# Patient Record
Sex: Male | Born: 1955
Health system: Southern US, Community
[De-identification: ages and names within clinical notes are randomized; demographics above are authoritative.]

## PROBLEM LIST (undated history)

## (undated) DIAGNOSIS — F1020 Alcohol dependence, uncomplicated: Secondary | ICD-10-CM

## (undated) DIAGNOSIS — E119 Type 2 diabetes mellitus without complications: Secondary | ICD-10-CM

## (undated) DIAGNOSIS — I10 Essential (primary) hypertension: Secondary | ICD-10-CM

## (undated) DIAGNOSIS — J45909 Unspecified asthma, uncomplicated: Secondary | ICD-10-CM

## (undated) DIAGNOSIS — F17200 Nicotine dependence, unspecified, uncomplicated: Secondary | ICD-10-CM

---

## 1998-03-25 ENCOUNTER — Emergency Department (HOSPITAL_COMMUNITY): Admission: EM | Admit: 1998-03-25 | Discharge: 1998-03-25 | Payer: Self-pay | Admitting: Emergency Medicine

## 2005-11-08 ENCOUNTER — Emergency Department (HOSPITAL_COMMUNITY): Admission: EM | Admit: 2005-11-08 | Discharge: 2005-11-08 | Payer: Self-pay | Admitting: Emergency Medicine

## 2012-06-19 ENCOUNTER — Encounter (HOSPITAL_COMMUNITY): Payer: Self-pay | Admitting: Adult Health

## 2012-06-19 ENCOUNTER — Emergency Department (HOSPITAL_COMMUNITY): Payer: 59

## 2012-06-19 ENCOUNTER — Other Ambulatory Visit: Payer: Self-pay

## 2012-06-19 ENCOUNTER — Emergency Department (HOSPITAL_COMMUNITY)
Admission: EM | Admit: 2012-06-19 | Discharge: 2012-06-19 | Disposition: A | Discharge status: Home / Self Care | Payer: 59 | Attending: Emergency Medicine | Admitting: Emergency Medicine

## 2012-06-19 DIAGNOSIS — J069 Acute upper respiratory infection, unspecified: Secondary | ICD-10-CM | POA: Insufficient documentation

## 2012-06-19 DIAGNOSIS — R059 Cough, unspecified: Secondary | ICD-10-CM | POA: Insufficient documentation

## 2012-06-19 DIAGNOSIS — B9789 Other viral agents as the cause of diseases classified elsewhere: Secondary | ICD-10-CM

## 2012-06-19 DIAGNOSIS — R05 Cough: Secondary | ICD-10-CM | POA: Insufficient documentation

## 2012-06-19 DIAGNOSIS — F17200 Nicotine dependence, unspecified, uncomplicated: Secondary | ICD-10-CM | POA: Insufficient documentation

## 2012-06-19 LAB — CBC
Hemoglobin: 14 g/dL (ref 13.0–17.0)
Platelets: 250 10*3/uL (ref 150–400)
RBC: 4.78 MIL/uL (ref 4.22–5.81)
WBC: 17.2 10*3/uL — ABNORMAL HIGH (ref 4.0–10.5)

## 2012-06-19 LAB — BASIC METABOLIC PANEL
CO2: 24 mEq/L (ref 19–32)
Chloride: 97 mEq/L (ref 96–112)
Sodium: 134 mEq/L — ABNORMAL LOW (ref 135–145)

## 2012-06-19 LAB — CG4 I-STAT (LACTIC ACID): Lactic Acid, Venous: 1.19 mmol/L (ref 0.5–2.2)

## 2012-06-19 LAB — POCT I-STAT TROPONIN I: Troponin i, poc: 0 ng/mL (ref 0.00–0.08)

## 2012-06-19 MED ORDER — SODIUM CHLORIDE 0.9 % IV BOLUS (SEPSIS)
2000.0000 mL | Freq: Once | INTRAVENOUS | Status: AC
  Administered 2012-06-19: 2000 mL via INTRAVENOUS

## 2012-06-19 MED ORDER — ALBUTEROL SULFATE (5 MG/ML) 0.5% IN NEBU
5.0000 mg | INHALATION_SOLUTION | Freq: Once | RESPIRATORY_TRACT | Status: AC
  Administered 2012-06-19: 5 mg via RESPIRATORY_TRACT
  Filled 2012-06-19: qty 1

## 2012-06-19 MED ORDER — POTASSIUM CHLORIDE CRYS ER 20 MEQ PO TBCR
40.0000 meq | EXTENDED_RELEASE_TABLET | Freq: Once | ORAL | Status: AC
  Administered 2012-06-19: 40 meq via ORAL
  Filled 2012-06-19: qty 2

## 2012-06-19 MED ORDER — POTASSIUM CHLORIDE CRYS ER 20 MEQ PO TBCR: 20.0000 meq | EXTENDED_RELEASE_TABLET | Freq: Two times a day (BID) | ORAL | Status: DC

## 2012-06-19 MED ORDER — ACETAMINOPHEN 325 MG PO TABS
975.0000 mg | ORAL_TABLET | Freq: Once | ORAL | Status: AC
  Administered 2012-06-19: 975 mg via ORAL
  Filled 2012-06-19: qty 3

## 2012-06-19 MED ORDER — ALBUTEROL SULFATE HFA 108 (90 BASE) MCG/ACT IN AERS
2.0000 | INHALATION_SPRAY | RESPIRATORY_TRACT | Status: DC | PRN
  Administered 2012-06-19: 2 via RESPIRATORY_TRACT
  Filled 2012-06-19: qty 6.7

## 2012-06-19 NOTE — ED Provider Notes (Addendum)
History     CSN: [id number]  Arrival date & time 06/19/12  1523   First MD Initiated Contact with Patient 06/19/12 1653      Chief Complaint  Patient presents with  . Shortness of Breath    (Consider location/radiation/quality/duration/timing/severity/associated sxs/prior treatment) HPI Complains of cough productive of yellow sputum, mild shortness of breath and chest pain worse with coughing onset yesterday. With Tylenol with partial relief. He denies any pain presently. Denies nausea or vomiting no other complaint. No other associated symptoms History reviewed. No pertinent past medical history. Past medical history asthma History reviewed. No pertinent past surgical history.  History reviewed. No pertinent family history.  History  Substance Use Topics  . Smoking status: Current Every Day Smoker  . Smokeless tobacco: Not on file  . Alcohol Use: Yes      Review of Systems  Constitutional: Negative.   HENT: Negative.   Respiratory: Positive for cough and shortness of breath.   Cardiovascular: Negative.   Gastrointestinal: Negative.   Musculoskeletal: Negative.   Skin: Negative.   Neurological: Negative.   Hematological: Negative.   Psychiatric/Behavioral: Negative.   All other systems reviewed and are negative.    Allergies  Review of patient's allergies indicates no known allergies.  Home Medications   Current Outpatient Rx  Name  Route  Sig  Dispense  Refill  . ACETAMINOPHEN 500 MG PO TABS   Oral   Take 500 mg by mouth every 6 (six) hours as needed. For pain           BP 144/82  Pulse 108  Temp 101.6 F (38.7 C) (Oral)  Resp 24  SpO2 97%  Physical Exam  Nursing note and vitals reviewed. Constitutional: He appears well-developed and well-nourished. No distress.  HENT:  Head: Normocephalic and atraumatic.  Eyes: Conjunctivae normal are normal. Pupils are equal, round, and reactive to light.  Neck: Neck supple. No tracheal deviation present.  No thyromegaly present.  Cardiovascular: Normal rate and regular rhythm.   No murmur heard. Pulmonary/Chest: Effort normal. No respiratory distress.       Diffuse rhonchi, scant  Abdominal: Soft. Bowel sounds are normal. He exhibits no distension. There is no tenderness.  Musculoskeletal: Normal range of motion. He exhibits no edema and no tenderness.  Neurological: He is alert. Coordination normal.       Awake alert not sleepy  Skin: Skin is warm and dry. No rash noted.  Psychiatric: He has a normal mood and affect.    ED Course  Procedures (including critical care time)  Labs Reviewed  CBC - Abnormal; Notable for the following:    WBC 17.2 (*)     All other components within normal limits  BASIC METABOLIC PANEL - Abnormal; Notable for the following:    Sodium 134 (*)     Potassium 2.9 (*)     Glucose, Bld 183 (*)     All other components within normal limits  POCT I-STAT TROPONIN I   Dg Chest 2 View  06/19/2012  *RADIOLOGY REPORT*  Clinical Data: Shortness of breath, cough, left-sided chest pain  CHEST - 2 VIEW  Comparison: None.  Findings: No active infiltrate or effusion is seen.  There is peribronchial thickening which may indicate bronchitis.  Probable nipple shadows are noted symmetrically at the lung bases.  The heart is mildly enlarged.  There are degenerative changes throughout the thoracic spine.  IMPRESSION: No active lung disease.  Question bronchitis.  Mild cardiomegaly.   Original Report  Authenticated By: Dwyane Dee, M.D.      No diagnosis found. Results for orders placed during the hospital encounter of 06/19/12  CBC      Component Value Range   WBC 17.2 (*) 4.0 - 10.5 K/uL   RBC 4.78  4.22 - 5.81 MIL/uL   Hemoglobin 14.0  13.0 - 17.0 g/dL   HCT 96.2  95.2 - 84.1 %   MCV 87.4  78.0 - 100.0 fL   MCH 29.3  26.0 - 34.0 pg   MCHC 33.5  30.0 - 36.0 g/dL   RDW 32.4  40.1 - 02.7 %   Platelets 250  150 - 400 K/uL  BASIC METABOLIC PANEL      Component Value Range     Sodium 134 (*) 135 - 145 mEq/L   Potassium 2.9 (*) 3.5 - 5.1 mEq/L   Chloride 97  96 - 112 mEq/L   CO2 24  19 - 32 mEq/L   Glucose, Bld 183 (*) 70 - 99 mg/dL   BUN 13  6 - 23 mg/dL   Creatinine, Ser 2.53  0.50 - 1.35 mg/dL   Calcium 9.1  8.4 - 66.4 mg/dL   GFR calc non Af Amer >90  >90 mL/min   GFR calc Af Amer >90  >90 mL/min  POCT I-STAT TROPONIN I      Component Value Range   Troponin i, poc 0.00  0.00 - 0.08 ng/mL   Comment 3           CG4 I-STAT (LACTIC ACID)      Component Value Range   Lactic Acid, Venous 1.19  0.5 - 2.2 mmol/L   Date: 06/19/2012  Rate: 110  Rhythm: sinus tachycardia  QRS Axis: normal  Intervals: normal  ST/T Wave abnormalities: nonspecific ST/T changes  Conduction Disutrbances:none  Narrative Interpretation: LVH with strain pattern  Old EKG Reviewed: none available  Dg Chest 2 View  06/19/2012  *RADIOLOGY REPORT*  Clinical Data: Shortness of breath, cough, left-sided chest pain  CHEST - 2 VIEW  Comparison: None.  Findings: No active infiltrate or effusion is seen.  There is peribronchial thickening which may indicate bronchitis.  Probable nipple shadows are noted symmetrically at the lung bases.  The heart is mildly enlarged.  There are degenerative changes throughout the thoracic spine.  IMPRESSION: No active lung disease.  Question bronchitis.  Mild cardiomegaly.   Original Report Authenticated By: Dwyane Dee, M.D.      Chest x-ray reviewed by me  7:35 PM patient feels much improved after nebulized treatment and ready to home MDM  Plan smoking cessation encouraged. Spent 5 minutes counseling patient on smoking cessation Tylenol for fever or aches Albuterol HFA to go  2 puffsevery 4 hours when necessary shortness of breath or cough. Antibiotics  not prescribed,, no infiltrate on x-ray. Prescription K-Dur Referral resource guide Diagnosis #1. Febrile respiratory illness #2 tobacco abuse #3hypokalemia #4 hyperglycemia     Doug Sou,  MD 06/19/12 1940  Doug Sou, MD 06/19/12 4034

## 2012-06-19 NOTE — ED Notes (Signed)
Pt reports sternal chest pain that feels "like someone hit me in the chest" Pain began last night while washing dishes. Pain is constant. Pain is associated with SOB. Pt continues to fall asleep while speaking to nurse, he is easily arousable to voice. He staes he did not get any sleep last night. He is tachypneic 24-28 Sats 96% RA. Rhales auscultated on left lower- mid. Cough dry.

## 2012-06-25 ENCOUNTER — Emergency Department (HOSPITAL_COMMUNITY)
Admission: EM | Admit: 2012-06-25 | Discharge: 2012-06-25 | Disposition: A | Discharge status: Home / Self Care | Payer: 59 | Attending: Emergency Medicine | Admitting: Emergency Medicine

## 2012-06-25 ENCOUNTER — Encounter (HOSPITAL_COMMUNITY): Payer: Self-pay | Admitting: *Deleted

## 2012-06-25 DIAGNOSIS — J209 Acute bronchitis, unspecified: Secondary | ICD-10-CM | POA: Insufficient documentation

## 2012-06-25 DIAGNOSIS — J208 Acute bronchitis due to other specified organisms: Secondary | ICD-10-CM

## 2012-06-25 DIAGNOSIS — J45909 Unspecified asthma, uncomplicated: Secondary | ICD-10-CM | POA: Insufficient documentation

## 2012-06-25 DIAGNOSIS — F17200 Nicotine dependence, unspecified, uncomplicated: Secondary | ICD-10-CM | POA: Insufficient documentation

## 2012-06-25 DIAGNOSIS — Z79899 Other long term (current) drug therapy: Secondary | ICD-10-CM | POA: Insufficient documentation

## 2012-06-25 DIAGNOSIS — I10 Essential (primary) hypertension: Secondary | ICD-10-CM | POA: Insufficient documentation

## 2012-06-25 HISTORY — DX: Unspecified asthma, uncomplicated: J45.909

## 2012-06-25 HISTORY — DX: Essential (primary) hypertension: I10

## 2012-06-25 NOTE — ED Notes (Signed)
In to assess; unable to locate pt

## 2012-06-25 NOTE — ED Provider Notes (Signed)
History   This chart was scribed for non-physician practitioner working with Juan Booze, MD by Juan Boyle, ED Scribe. This patient was seen in room TR07C/TR07C and the patient's care was started at 4:24PM.    CSN: [id number]  Arrival date & time 06/25/12  1606   First MD Initiated Contact with Patient 06/25/12 1624      No chief complaint on file.   (Consider location/radiation/quality/duration/timing/severity/associated sxs/prior treatment) Patient is a 57 y.o. male presenting with general illness. The history is provided by the patient. No language interpreter was used.  Illness  The current episode started yesterday. The onset was sudden. The problem occurs rarely. The problem has been gradually improving. The problem is moderate. The symptoms are relieved by one or more prescription drugs. Nothing aggravates the symptoms. Pertinent negatives include no congestion, no rhinorrhea and no sore throat.    Juan Boyle is a 57 y.o. male , with a hx of asthma, wheezing, and bronchitis (06/19/12), who presents to the Emergency Department complaining of gradual, progressively improving, wheezing, last episode yesterday (06/24/12). The pt reports he was administered an albuterol inhaler after previous evaluation at [facility] for bronchitis on 06/19/12, however he informs he has run out of his albuterol inhaler medication at this time. The pt is taking his previously prescribed medications at present.   The pt denies fever.   The pt is a current everyday smoker in addition to drinking alcohol.    Pt does not have a PCP at present.    Past Medical History  Diagnosis Date  . Asthma   . Hypertension     History reviewed. No pertinent past surgical history.  No family history on file.  History  Substance Use Topics  . Smoking status: Current Every Day Smoker  . Smokeless tobacco: Not on file  . Alcohol Use: Yes      Review of Systems  HENT: Negative for congestion, sore throat and  rhinorrhea.   All other systems reviewed and are negative.    Allergies  Penicillins  Home Medications   Current Outpatient Rx  Name  Route  Sig  Dispense  Refill  . ACETAMINOPHEN 500 MG PO TABS   Oral   Take 500 mg by mouth every 6 (six) hours as needed. For pain         . POTASSIUM CHLORIDE CRYS ER 20 MEQ PO TBCR   Oral   Take 1 tablet (20 mEq total) by mouth 2 (two) times daily.   10 tablet   0     BP 150/93  Pulse 93  Temp 98 F (36.7 C) (Oral)  Resp 18  SpO2 98%  Physical Exam  Nursing note and vitals reviewed. Constitutional: He is oriented to person, place, and time. He appears well-developed and well-nourished.  HENT:  Head: Atraumatic.  Right Ear: External ear normal.  Left Ear: External ear normal.  Nose: Nose normal.  Mouth/Throat: No oropharyngeal exudate.  Neck: Neck supple.  Cardiovascular: Normal rate, regular rhythm and normal heart sounds.   No murmur heard. Pulmonary/Chest: Effort normal and breath sounds normal.  Abdominal: Soft. Bowel sounds are normal.  Musculoskeletal: Normal range of motion. He exhibits no tenderness.  Lymphadenopathy:    He has no cervical adenopathy.  Neurological: He is alert and oriented to person, place, and time.  Skin: Skin is warm and dry.  Psychiatric: He has a normal mood and affect. His behavior is normal.    ED Course  Procedures (including critical care  time)  DIAGNOSTIC STUDIES: Oxygen Saturation is 98% on room air, normal by my interpretation.    COORDINATION OF CARE:  4:27 PM- Treatment plan discussed with patient. Pt agrees with treatment.      Labs Reviewed - No data to display No results found.   No diagnosis found.  Patient seen on 06/20/11 for URI symptoms.  Found to have mild hypokalemia at that time.  Patient prescribed an albuterol inhaler and replacement potassium.  Patient states that he is feeling better, symptoms have improved, occasional episodes of wheezing.  Denies fever,  nasal congestion, sore throat, cough.  Planned to recheck I-stat, however patient apparently left prior to testing.  MDM   I personally performed the services described in this documentation, which was scribed in my presence. The recorded information has been reviewed and is accurate.         Juan Norman, NP 06/26/12 [phone]

## 2012-06-25 NOTE — ED Notes (Signed)
Pt is here for follow up on breathing and bad cold.  PT was given a pump and white pills to take.  Pt states he has ran out of inhaler.  Pt states feeling better and wants to know if he is getting better.  No fever.  Breathing is better

## 2012-06-26 NOTE — ED Provider Notes (Signed)
Medical screening examination/treatment/procedure(s) were performed by non-physician practitioner and as supervising physician I was immediately available for consultation/collaboration.   [name], MD 06/26/12 [phone]

## 2014-07-22 ENCOUNTER — Emergency Department (HOSPITAL_COMMUNITY)
Admission: EM | Admit: 2014-07-22 | Discharge: 2014-07-22 | Disposition: A | Discharge status: Home / Self Care | Payer: 59 | Attending: Emergency Medicine | Admitting: Emergency Medicine

## 2014-07-22 ENCOUNTER — Encounter (HOSPITAL_COMMUNITY): Payer: Self-pay | Admitting: Emergency Medicine

## 2014-07-22 DIAGNOSIS — Z880 Allergy status to penicillin: Secondary | ICD-10-CM | POA: Diagnosis not present

## 2014-07-22 DIAGNOSIS — M79605 Pain in left leg: Secondary | ICD-10-CM | POA: Diagnosis present

## 2014-07-22 DIAGNOSIS — J45909 Unspecified asthma, uncomplicated: Secondary | ICD-10-CM | POA: Diagnosis not present

## 2014-07-22 DIAGNOSIS — Z720 Tobacco use: Secondary | ICD-10-CM | POA: Diagnosis not present

## 2014-07-22 DIAGNOSIS — I10 Essential (primary) hypertension: Secondary | ICD-10-CM | POA: Diagnosis not present

## 2014-07-22 DIAGNOSIS — M7062 Trochanteric bursitis, left hip: Secondary | ICD-10-CM | POA: Insufficient documentation

## 2014-07-22 DIAGNOSIS — Z79899 Other long term (current) drug therapy: Secondary | ICD-10-CM | POA: Insufficient documentation

## 2014-07-22 DIAGNOSIS — Y9389 Activity, other specified: Secondary | ICD-10-CM | POA: Insufficient documentation

## 2014-07-22 MED ORDER — IBUPROFEN 600 MG PO TABS: 600.0000 mg | ORAL_TABLET | Freq: Four times a day (QID) | ORAL | Status: DC | PRN

## 2014-07-22 MED ORDER — TRAMADOL HCL 50 MG PO TABS: 50.0000 mg | ORAL_TABLET | Freq: Four times a day (QID) | ORAL | Status: DC | PRN

## 2014-07-22 MED ORDER — IBUPROFEN 800 MG PO TABS
800.0000 mg | ORAL_TABLET | Freq: Once | ORAL | Status: AC
  Administered 2014-07-22: 800 mg via ORAL
  Filled 2014-07-22: qty 1
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
## 2014-07-22 NOTE — Discharge Instructions (Signed)
Please read and follow all provided instructions.  Your diagnoses today include:  1. Trochanteric bursitis of left hip     Tests performed today include:  Vital signs. See below for your results today.   Medications prescribed:   Ibuprofen (Motrin, Advil) - anti-inflammatory pain medication  Do not exceed 600mg  ibuprofen every 6 hours, take with food  You have been prescribed an anti-inflammatory medication or NSAID. Take with food. Take smallest effective dose for the shortest duration needed for your pain. Stop taking if you experience stomach pain or vomiting.    Tramadol - narcotic-like pain medication  DO NOT drive or perform any activities that require you to be awake and alert because this medicine can make you drowsy.   Take any prescribed medications only as directed.  Home care instructions:   Follow any educational materials contained in this packet  Follow R.I.C.E. Protocol:  R - rest your injury   I  - use ice on injury without applying directly to skin  C - compress injury with bandage or splint  E - elevate the injury as much as possible  Follow-up instructions: Please follow-up with your primary care provider if you continue to have significant pain in 1 week. In this case you may have a more severe injury that requires further care.   Return instructions:   Please return if your toes or feet are numb or tingling, appear gray or blue, or you have severe pain (also elevate the leg and loosen splint or wrap if you were given one)  Please return to the Emergency Department if you experience worsening symptoms.   Please return if you have any other emergent concerns.  Additional Information:  Your vital signs today were: Pulse 85   Temp(Src) 97.5 F (36.4 C) (Oral)   Resp 18   Ht 4\' 11"  (1.499 m)   Wt 120 lb (54.432 kg)   BMI 24.22 kg/m2   SpO2 97% If your blood pressure (BP) was elevated above 135/85 this visit, please have this repeated by your  doctor within one month. --------------

## 2014-07-22 NOTE — ED Notes (Signed)
Vital signs stable. 
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
## 2015-10-22 ENCOUNTER — Emergency Department (HOSPITAL_COMMUNITY)
Admission: EM | Admit: 2015-10-22 | Discharge: 2015-10-22 | Disposition: A | Discharge status: Home / Self Care | Payer: 59 | Attending: Emergency Medicine | Admitting: Emergency Medicine

## 2015-10-22 ENCOUNTER — Encounter (HOSPITAL_COMMUNITY): Payer: Self-pay | Admitting: *Deleted

## 2015-10-22 DIAGNOSIS — H9192 Unspecified hearing loss, left ear: Secondary | ICD-10-CM | POA: Insufficient documentation

## 2015-10-22 DIAGNOSIS — H918X2 Other specified hearing loss, left ear: Secondary | ICD-10-CM | POA: Diagnosis not present

## 2015-10-22 DIAGNOSIS — F17200 Nicotine dependence, unspecified, uncomplicated: Secondary | ICD-10-CM | POA: Diagnosis not present

## 2015-10-22 DIAGNOSIS — I10 Essential (primary) hypertension: Secondary | ICD-10-CM | POA: Diagnosis not present

## 2015-10-22 DIAGNOSIS — M25512 Pain in left shoulder: Secondary | ICD-10-CM | POA: Diagnosis not present

## 2015-10-22 DIAGNOSIS — H578 Other specified disorders of eye and adnexa: Secondary | ICD-10-CM | POA: Diagnosis present

## 2015-10-22 DIAGNOSIS — J45909 Unspecified asthma, uncomplicated: Secondary | ICD-10-CM | POA: Diagnosis not present

## 2015-10-22 DIAGNOSIS — Z79899 Other long term (current) drug therapy: Secondary | ICD-10-CM | POA: Insufficient documentation

## 2015-10-22 DIAGNOSIS — H11442 Conjunctival cysts, left eye: Secondary | ICD-10-CM | POA: Insufficient documentation

## 2015-10-22 DIAGNOSIS — Z791 Long term (current) use of non-steroidal anti-inflammatories (NSAID): Secondary | ICD-10-CM | POA: Diagnosis not present

## 2015-10-22 DIAGNOSIS — H109 Unspecified conjunctivitis: Secondary | ICD-10-CM | POA: Diagnosis not present

## 2015-10-22 MED ORDER — NAPROXEN 500 MG PO TABS: 500.0000 mg | ORAL_TABLET | Freq: Two times a day (BID) | ORAL | Status: DC

## 2015-10-22 MED FILL — NAPROXEN 500 MG TABLET: 500 | 30 days supply | Qty: 60 | Fill #0

## 2015-10-22 NOTE — Discharge Instructions (Signed)
1. Medications: naprosyn as needed for your shoulder pain, usual home medications 2. Treatment: rest, drink plenty of fluids,  3. Follow Up: Please followup with ophthalmology for your eye and ENT for your hearing loss; if you do not have a primary care doctor use the resource guide provided to find one; Please return to the ER for vision changes, headaches or other concerns

## 2015-10-22 NOTE — ED Provider Notes (Signed)
CSN: [id number]     Arrival date & time 10/22/15  1157 History   First MD Initiated Contact with Patient 10/22/15 1430     Chief Complaint  Patient presents with  . Eye Problem     (Consider location/radiation/quality/duration/timing/severity/associated sxs/prior Treatment) The history is provided by the patient and medical records. No language interpreter was used.     Juan Boyle is a 60 y.o. male  with a hx of asthma presents to the Emergency Department with multiple complaints.  Pt c/o "growth" to the left eye.  He denies changes In vision, pain, decreased EOMs, photophobia.  He reports it has been growing slowly for greater than one month. He has not seen an ophthalmologist for this. He denies trauma to the eye or foreign body in the eye.  Patient also complaining of gradually occurring hearing loss in the left ear. Patient reports that it has been slowly worsening over the last 6 months. He denies tinnitus, vertigo, balance issues.  Denies trauma to his ear. He reports cleaning is irregularly but does not use Q-tips. No drainage from the ear.  Patient also complaining of left shoulder pain. He reports that it occurs intermittently for 3 or 4 months. 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 At other times his shoulder does not hurt. He denies trauma.  No treatment for this prior to arrival.  Past Medical History  Diagnosis Date  . Asthma   . Hypertension    History reviewed. No pertinent past surgical history. History reviewed. No pertinent family history. Social History  Substance Use Topics  . Smoking status: Current Every Day Smoker -- 0.50 packs/day for 40 years  . Smokeless tobacco: None  . Alcohol Use: 12.6 oz/week    21 Cans of beer per week    Review of Systems  Constitutional: Negative for fever, diaphoresis, appetite change, fatigue and unexpected weight change.  HENT: Positive for hearing loss ( > 6 mos). Negative for mouth sores.    Eyes: Negative for photophobia, pain, discharge, redness, itching and visual disturbance.       Growth  Respiratory: Negative for cough, chest tightness, shortness of breath and wheezing.   Cardiovascular: Negative for chest pain.  Gastrointestinal: Negative for nausea, vomiting, abdominal pain, diarrhea and constipation.  Endocrine: Negative for polydipsia, polyphagia and polyuria.  Genitourinary: Negative for dysuria, urgency, frequency and hematuria.  Musculoskeletal: Positive for arthralgias ( Left shoulder). Negative for back pain and neck stiffness.  Skin: Negative for rash.  Allergic/Immunologic: Negative for immunocompromised state.  Neurological: Negative for syncope, light-headedness and headaches.  Hematological: Does not bruise/bleed easily.  Psychiatric/Behavioral: Negative for sleep disturbance. The patient is not nervous/anxious.       Allergies  Penicillins  Home Medications   Prior to Admission medications   Medication Sig Start Date End Date Taking? Authorizing Provider  albuterol (PROVENTIL HFA;VENTOLIN HFA) 108 (90 BASE) MCG/ACT inhaler Inhale 2 puffs into the lungs every 6 (six) hours as needed. For wheezing/shortness of breath    Historical Provider, MD  ibuprofen (ADVIL,MOTRIN) 600 MG tablet Take 1 tablet (600 mg total) by mouth every 6 (six) hours as needed. 07/22/14   Carlisle Cater, PA-C  naproxen (NAPROSYN) 500 MG tablet Take 1 tablet (500 mg total) by mouth 2 (two) times daily with a meal. 10/22/15   Layton Tappan, PA-C  potassium chloride SA (K-DUR,KLOR-CON) 20 MEQ tablet Take 1 tablet (20 mEq total) by mouth 2 (two) times daily. 06/19/12  Orlie Dakin, MD  traMADol (ULTRAM) 50 MG tablet Take 1 tablet (50 mg total) by mouth every 6 (six) hours as needed. 07/22/14   Carlisle Cater, PA-C   BP 190/96 mmHg  Pulse 95  Temp(Src) 98.6 F (37 C) (Oral)  Resp 16  SpO2 100% Physical Exam  Constitutional: He is oriented to person, place, and time. He appears  well-developed and well-nourished. No distress.  HENT:  Head: Normocephalic and atraumatic.  Right Ear: Hearing, tympanic membrane, external ear and ear canal normal. Tympanic membrane is not injected, not scarred, not perforated and not erythematous.  Left Ear: Tympanic membrane, external ear and ear canal normal. Tympanic membrane is not injected, not scarred, not perforated and not erythematous.  Nose: Nose normal. No mucosal edema or rhinorrhea.  Mouth/Throat: Uvula is midline, oropharynx is clear and moist and mucous membranes are normal. No uvula swelling. No oropharyngeal exudate, posterior oropharyngeal edema, posterior oropharyngeal erythema or tonsillar abscesses.  Decreased hearing of the left ear on gross hearing assessment  Eyes: Conjunctivae, EOM and lids are normal. Pupils are equal, round, and reactive to light. Lids are everted and swept, no foreign bodies found. Right eye exhibits no chemosis, no discharge and no exudate. No foreign body present in the right eye. Left eye exhibits no chemosis, no discharge and no exudate. No foreign body present in the left eye. Right conjunctiva is not injected. Right conjunctiva has no hemorrhage. Left conjunctiva is not injected. Left conjunctiva has no hemorrhage.  Slit lamp exam:      The right eye shows no corneal abrasion, no corneal flare, no corneal ulcer, no foreign body, no fluorescein uptake and no anterior chamber bulge.       The left eye shows no corneal abrasion, no corneal flare, no corneal ulcer, no foreign body, no fluorescein uptake and no anterior chamber bulge.  Pupils equal round and reactive to light No vertical, horizontal or rotational nystagmus No visible foreign body No corneal flare No herpetic lesions to the face or around the eye Left upper lid with cyst of the conjunctiva  Visual Acuity:  Bilateral Distance: 20/50  R Distance: 20/80  L Distance: 20/80  Neck: Normal range of motion.  Full range of motion  without pain No midline or paraspinal tenderness No nuchal rigidity; no meningeal signs  Cardiovascular: Normal rate, regular rhythm, normal heart sounds and intact distal pulses.   No murmur heard. Pulmonary/Chest: Effort normal. No respiratory distress.  Musculoskeletal: Normal range of motion.  FROM of bilateral shoulders without joint line pain  Neurological: He is alert and oriented to person, place, and time.  Mental Status:  Alert, oriented, thought content appropriate. Speech fluent without evidence of aphasia. Able to follow 2 step commands without difficulty.   Cranial Nerves:  II:  Peripheral visual fields grossly normal, pupils equal, round, reactive to light III,IV, VI: ptosis not present, extra-ocular motions intact bilaterally  V,VII: smile symmetric, VIII: hearing grossly decreased on the left  IX,X: midline uvula rise XI: bilateral shoulder shrug equal XII: midline tongue extension   Skin: Skin is warm and dry. No rash noted. He is not diaphoretic. No erythema.  Psychiatric: He has a normal mood and affect.  Nursing note and vitals reviewed.   ED Course  Procedures (including critical care time)   MDM   Final diagnoses:  Conjunctival cyst of left eye  Left ear hearing loss  Left shoulder pain    BREYSON FIEBELKORN presents with several complaints.  The growth  on patient's conjunctiva appears to be an inclusion cyst versus tumor. It is not necrotic. No changes in his vision. Full EOMs.  She will need follow-up with ophthalmology for this.  Patient with some hearing loss on gross assessment. No cerumen in the canal. No trauma to the TM. No signs of infection.  Patient will need ear nose and throat follow-up for this.  Patient with full range of motion of left shoulder. No joint line tenderness. No trauma. No x-ray indicated at this time. Suspect strain versus arthritis. Will give Naprosyn to be taken with meals for pain control. He is to follow-up with his primary  care provider for this.  Patient noted to be hypertensive in the emergency department.  No signs of hypertensive urgency.  Discussed with patient the need for close follow-up and management by their primary care physician.    Jarrett Soho Hallie Ertl, PA-C 10/22/15 [place], MD 10/25/15 [phone]

## 2015-10-22 NOTE — ED Notes (Signed)
Pt reports left eye growth x 1 month, also having difficulty hearing out of left ear for extended amount of time.

## 2015-10-26 DIAGNOSIS — H18413 Arcus senilis, bilateral: Secondary | ICD-10-CM | POA: Diagnosis not present

## 2015-10-26 DIAGNOSIS — H02839 Dermatochalasis of unspecified eye, unspecified eyelid: Secondary | ICD-10-CM | POA: Diagnosis not present

## 2015-10-26 DIAGNOSIS — H269 Unspecified cataract: Secondary | ICD-10-CM | POA: Diagnosis not present

## 2015-10-26 DIAGNOSIS — D3100 Benign neoplasm of unspecified conjunctiva: Secondary | ICD-10-CM | POA: Diagnosis not present

## 2015-10-29 DIAGNOSIS — H9193 Unspecified hearing loss, bilateral: Secondary | ICD-10-CM | POA: Diagnosis not present

## 2015-10-29 DIAGNOSIS — F17200 Nicotine dependence, unspecified, uncomplicated: Secondary | ICD-10-CM | POA: Insufficient documentation

## 2015-10-29 DIAGNOSIS — H903 Sensorineural hearing loss, bilateral: Secondary | ICD-10-CM | POA: Diagnosis not present

## 2016-12-26 ENCOUNTER — Encounter (HOSPITAL_COMMUNITY): Payer: Self-pay | Admitting: Emergency Medicine

## 2016-12-26 ENCOUNTER — Emergency Department (HOSPITAL_COMMUNITY)
Admission: EM | Admit: 2016-12-26 | Discharge: 2016-12-27 | Disposition: A | Discharge status: Home / Self Care | Payer: PRIVATE HEALTH INSURANCE | Attending: Emergency Medicine | Admitting: Emergency Medicine

## 2016-12-26 DIAGNOSIS — Y92000 Kitchen of unspecified non-institutional (private) residence as  the place of occurrence of the external cause: Secondary | ICD-10-CM | POA: Diagnosis not present

## 2016-12-26 DIAGNOSIS — J45909 Unspecified asthma, uncomplicated: Secondary | ICD-10-CM | POA: Diagnosis not present

## 2016-12-26 DIAGNOSIS — Y9389 Activity, other specified: Secondary | ICD-10-CM | POA: Insufficient documentation

## 2016-12-26 DIAGNOSIS — Z23 Encounter for immunization: Secondary | ICD-10-CM | POA: Diagnosis not present

## 2016-12-26 DIAGNOSIS — Y998 Other external cause status: Secondary | ICD-10-CM | POA: Diagnosis not present

## 2016-12-26 DIAGNOSIS — S61412A Laceration without foreign body of left hand, initial encounter: Secondary | ICD-10-CM | POA: Insufficient documentation

## 2016-12-26 DIAGNOSIS — W290XXA Contact with powered kitchen appliance, initial encounter: Secondary | ICD-10-CM | POA: Diagnosis not present

## 2016-12-26 DIAGNOSIS — S61411A Laceration without foreign body of right hand, initial encounter: Secondary | ICD-10-CM

## 2016-12-26 DIAGNOSIS — Z79899 Other long term (current) drug therapy: Secondary | ICD-10-CM | POA: Diagnosis not present

## 2016-12-26 DIAGNOSIS — F17200 Nicotine dependence, unspecified, uncomplicated: Secondary | ICD-10-CM | POA: Insufficient documentation

## 2016-12-26 NOTE — ED Notes (Addendum)
Pt hypertensive in triage, states he is supposed to take BP medications but does not. Denies chest pain/headache

## 2016-12-26 NOTE — ED Triage Notes (Signed)
Pt states he cut his left hand (between middle and ring finger) on a garbage disposal. Sensations intact to the left hand.

## 2016-12-27 ENCOUNTER — Emergency Department (HOSPITAL_COMMUNITY): Payer: PRIVATE HEALTH INSURANCE

## 2016-12-27 MED ORDER — ALBUTEROL SULFATE HFA 108 (90 BASE) MCG/ACT IN AERS
2.0000 | INHALATION_SPRAY | Freq: Once | RESPIRATORY_TRACT | Status: AC
  Administered 2016-12-27: 2 via RESPIRATORY_TRACT
  Filled 2016-12-27: qty 6.7

## 2016-12-27 MED ORDER — HYDROCODONE-ACETAMINOPHEN 5-325 MG PO TABS
1.0000 | ORAL_TABLET | Freq: Once | ORAL | Status: AC
  Administered 2016-12-27: 1 via ORAL
  Filled 2016-12-27: qty 1

## 2016-12-27 MED ORDER — LIDOCAINE HCL (PF) 1 % IJ SOLN
5.0000 mL | Freq: Once | INTRAMUSCULAR | Status: AC
  Administered 2016-12-27: 5 mL
  Filled 2016-12-27: qty 5

## 2016-12-27 MED ORDER — TETANUS-DIPHTH-ACELL PERTUSSIS 5-2.5-18.5 LF-MCG/0.5 IM SUSP
0.5000 mL | Freq: Once | INTRAMUSCULAR | Status: AC
  Administered 2016-12-27: 0.5 mL via INTRAMUSCULAR
  Filled 2016-12-27: qty 0.5

## 2016-12-27 NOTE — ED Provider Notes (Signed)
[facility] DEPT Provider Note   CSN: [id number] Arrival date & time: 12/26/16  2018     History   Chief Complaint Chief Complaint  Patient presents with  . Extremity Laceration    HPI DESTYN SCHUYLER is a 61 y.o. male.  HPI Patient presents to the emergency department with a laceration between the third and fourth digits on the left hand.  The patient states he reached into a garbage disposal to retrieve an item and that is when his hand got lacerated.  The patient states that he is able move his fingers and has sensation intact.  Patient states nothing seems make the condition better, movement makes the pain worse. Past Medical History:  Diagnosis Date  . Asthma   . Hypertension     There are no active problems to display for this patient.   History reviewed. No pertinent surgical history.     Home Medications    Prior to Admission medications   Medication Sig Start Date End Date Taking? Authorizing Provider  albuterol (PROVENTIL HFA;VENTOLIN HFA) 108 (90 BASE) MCG/ACT inhaler Inhale 2 puffs into the lungs every 6 (six) hours as needed. For wheezing/shortness of breath    [provider]  ibuprofen (ADVIL,MOTRIN) 600 MG tablet Take 1 tablet (600 mg total) by mouth every 6 (six) hours as needed. 07/22/14   Carlisle Cater, PA-C  naproxen (NAPROSYN) 500 MG tablet Take 1 tablet (500 mg total) by mouth 2 (two) times daily with a meal. 10/22/15   Muthersbaugh, Jarrett Soho, PA-C  potassium chloride SA (K-DUR,KLOR-CON) 20 MEQ tablet Take 1 tablet (20 mEq total) by mouth 2 (two) times daily. 06/19/12   Orlie Dakin, MD  traMADol (ULTRAM) 50 MG tablet Take 1 tablet (50 mg total) by mouth every 6 (six) hours as needed. 07/22/14   Carlisle Cater, PA-C    Family History No family history on file.  Social History Social History  Substance Use Topics  . Smoking status: Current Every Day Smoker    Packs/day: 0.50    Years: 40.00  . Smokeless tobacco: Never Used  .  Alcohol use 12.6 oz/week    21 Cans of beer per week     Allergies   Penicillins   Review of Systems Review of Systems All other systems negative except as documented in the HPI. All pertinent positives and negatives as reviewed in the HPI.  Physical Exam Updated Vital Signs BP (!) 208/106   Pulse 77   Temp 98.3 F (36.8 C) (Oral)   Resp 18   Ht 4\' 11"  (1.499 m)   Wt 63.5 kg (140 lb)   SpO2 98%   BMI 28.28 kg/m   Physical Exam  Constitutional: He is oriented to person, place, and time. He appears well-developed and well-nourished. No distress.  HENT:  Head: Normocephalic and atraumatic.  Eyes: Pupils are equal, round, and reactive to light.  Pulmonary/Chest: Effort normal.  Musculoskeletal:       Left hand: He exhibits tenderness and laceration. He exhibits normal range of motion, normal capillary refill and no deformity.       Hands: Neurological: He is alert and oriented to person, place, and time.  Skin: Skin is warm and dry.  Psychiatric: He has a normal mood and affect.  Nursing note and vitals reviewed.    ED Treatments / Results  Labs (all labs ordered are listed, but only abnormal results are displayed) Labs Reviewed - No data to display  EKG  EKG Interpretation None  Radiology No results found.  Procedures Procedures (including critical care time)  Medications Ordered in ED Medications  Tdap (BOOSTRIX) injection 0.5 mL (not administered)  HYDROcodone-acetaminophen (NORCO/VICODIN) 5-325 MG per tablet 1 tablet (not administered)     Initial Impression / Assessment and Plan / ED Course  I have reviewed the triage vital signs and the nursing notes.  Pertinent labs & imaging results that were available during my care of the patient were reviewed by me and considered in my medical decision making (see chart for details). Patient will get x-rays.  Due to the fact that it was a blunt trauma.  Patient will be sutured by the overnight PA-C  Harlene Ramus.  Final Clinical Impressions(s) / ED Diagnoses   Final diagnoses:  None    New Prescriptions New Prescriptions   No medications on file     Dalia Heading, PA-C 12/27/16 0040    Ward, Delice Bison, DO 12/27/16 [phone]

## 2016-12-27 NOTE — ED Provider Notes (Signed)
Hand-off from [employer], PA-C. Dispo pending xray and lac repair.   See initial provider's note for full HPI.  Briefly patient is a 61 year old male who presents to the ED with complaint of right hand laceration that occurred around 9 PM while he was at work. Patient reports placing his hand in a garbage disposal to retrieve an item when his hand was cut. Denies numbness, tingling, weakness. Denies taking medications prior to arrival. Tetanus status unknown. Denies use of anticoagulants. Bleeding controlled prior to arrival.  Physical Exam  BP (!) 208/106   Pulse 77   Temp 98.3 F (36.8 C) (Oral)   Resp 18   Ht 4\' 11"  (1.499 m)   Wt 63.5 kg (140 lb)   SpO2 98%   BMI 28.28 kg/m   Physical Exam  Constitutional: He is oriented to person, place, and time. He appears well-developed and well-nourished.  HENT:  Head: Normocephalic and atraumatic.  Eyes: Conjunctivae and EOM are normal. Right eye exhibits no discharge. Left eye exhibits no discharge. No scleral icterus.  Neck: Normal range of motion. Neck supple.  Cardiovascular: Normal rate and intact distal pulses.   Pulmonary/Chest: Effort normal.  Musculoskeletal: Normal range of motion. He exhibits no edema or tenderness.  FROM of right digits, hand and wrist with 5/5 strength. Equal grip strength bilaterally. Sensation grossly intact. 2+ radial pulse. Cap refill <2.   Neurological: He is alert and oriented to person, place, and time.  Skin: Skin is warm and dry. Capillary refill takes less than 2 seconds.  5cm laceration present between right 3rd and 4th digits, no visible foreign bodies, bone or tendons. No active bleeding.   Nursing note and vitals reviewed.   ED Course  .Marland KitchenLaceration Repair Date/Time: 12/27/2016 3:21 AM Performed by: Nona Dell Authorized by: Nona Dell   Consent:    Consent obtained:  Verbal   Consent given by:  Patient Anesthesia (see MAR for exact dosages):    Anesthesia  method:  Local infiltration   Local anesthetic:  Lidocaine 1% w/o epi Laceration details:    Location:  Hand   Length (cm):  5 Repair type:    Repair type:  Simple Pre-procedure details:    Preparation:  Patient was prepped and draped in usual sterile fashion and imaging obtained to evaluate for foreign bodies Exploration:    Wound exploration: wound explored through full range of motion and entire depth of wound probed and visualized     Wound extent: no foreign bodies/material noted, no muscle damage noted, no nerve damage noted, no tendon damage noted, no underlying fracture noted and no vascular damage noted     Contaminated: no   Treatment:    Area cleansed with:  Betadine and saline   Amount of cleaning:  Standard   Irrigation solution:  Sterile saline   Irrigation method:  Syringe   Visualized foreign bodies/material removed: no   Skin repair:    Repair method:  Sutures   Suture size:  4-0   Suture material:  Prolene   Suture technique:  Simple interrupted   Number of sutures:  11 Approximation:    Approximation:  Close   Vermilion border: well-aligned   Post-procedure details:    Dressing:  Antibiotic ointment and non-adherent dressing   Patient tolerance of procedure:  Tolerated well, no immediate complications    MDM Pressure irrigation performed. Wound explored and base of wound visualized in a bloodless field without evidence of foreign body.  Laceration occurred <  8 hours prior to repair which was well tolerated. Tdap updated.  Pt has no comorbidities to effect normal wound healing. Pt discharged without antibiotics.  Discussed suture home care with patient and answered questions. Pt to follow-up for wound check and suture removal in 7 days; they are to return to the ED sooner for signs of infection. Pt is hemodynamically stable with no complaints prior to dc.    Patient noted to be hypertensive in the ED. Denies any associated symptoms. Patient denies headache,  visual changes, lightheadedness, dizziness, shortness of breath, chest pain, abdominal pain, numbness, tingling, weakness. No signs of hypertensive emergency or urgency at this time. Patient reports history of high blood pressure but states he has never been on medication. Patient denies currently having a primary care provider. Patient given follow-up information for outpatient PCP, advised to follow-up within the next week to have his blood pressure rechecked for further management.        Nona Dell, PA-C 12/27/16 0323    Ward, Delice Bison, DO 12/27/16 [phone]

## 2016-12-27 NOTE — ED Notes (Signed)
EDP at bedside for sutures

## 2016-12-27 NOTE — Discharge Instructions (Signed)
Keep your wound clean using Dial antibacterial soap and water, pat dry. You may also apply a small amount of antibiotic ointment such as Neosporin to wound daily. You may take 600 mg ibuprofen every 6 hours as needed for pain relief. I also recommend resting, elevating and applying ice to right arm for 15-20 minutes 3-4 times daily to help with pain and swelling. Return to the emergency department or be seen by your primary care provider in 7 days for suture removal. Return to the emergency department sooner if symptoms worsen or new onset of fever, redness, swelling, warmth, drainage, decreased range of motion, numbness, tingling, weakness.   I also recommend following up with the primary care provider's office is below within the next week for follow-up evaluation to have your blood pressure recheck.

## 2016-12-28 ENCOUNTER — Ambulatory Visit (HOSPITAL_COMMUNITY)
Admission: EM | Admit: 2016-12-28 | Discharge: 2016-12-28 | Disposition: A | Discharge status: Home / Self Care | Payer: 59 | Attending: Family Medicine | Admitting: Family Medicine

## 2016-12-28 ENCOUNTER — Encounter (HOSPITAL_COMMUNITY): Payer: Self-pay | Admitting: Emergency Medicine

## 2016-12-28 DIAGNOSIS — I10 Essential (primary) hypertension: Secondary | ICD-10-CM | POA: Diagnosis not present

## 2016-12-28 DIAGNOSIS — S61219A Laceration without foreign body of unspecified finger without damage to nail, initial encounter: Secondary | ICD-10-CM

## 2016-12-28 DIAGNOSIS — S61412A Laceration without foreign body of left hand, initial encounter: Secondary | ICD-10-CM | POA: Diagnosis not present

## 2016-12-28 DIAGNOSIS — Z760 Encounter for issue of repeat prescription: Secondary | ICD-10-CM | POA: Diagnosis not present

## 2016-12-28 MED ORDER — HYDROCODONE-ACETAMINOPHEN 5-325 MG PO TABS: 1.0000 | ORAL_TABLET | Freq: Four times a day (QID) | ORAL | 0 refills | Status: DC | PRN

## 2016-12-28 MED ORDER — ALBUTEROL SULFATE HFA 108 (90 BASE) MCG/ACT IN AERS: 2.0000 | INHALATION_SPRAY | RESPIRATORY_TRACT | 0 refills | Status: DC | PRN

## 2016-12-28 MED ORDER — HYDROCHLOROTHIAZIDE 25 MG PO TABS: 25.0000 mg | ORAL_TABLET | Freq: Every day | ORAL | 0 refills | Status: DC

## 2016-12-28 MED FILL — HYDROCODON-APAP 5-325: 5-325 | 2 days supply | Qty: 10 | Fill #0

## 2016-12-28 MED FILL — HYDROCHLOROTHIAZIDE 25 MG T: 25 | 30 days supply | Qty: 30 | Fill #0

## 2016-12-28 NOTE — Discharge Instructions (Signed)
Please keep your scheduled follow up appointments.

## 2016-12-28 NOTE — ED Triage Notes (Signed)
Pt here today for follow up from his ED visit two days ago for hand laceration.  He was told at the time to establish a PCP to monitor his HBP.  Pt has not changed his dressing on his hand and has not washed it per the AVS instructions.  Pt's family states she tried to make an appointment to establish care but they could not see him until next Wednesday and he needs something for pain for the hand.  Pt's hand is red and swollen, but the wound appears to be intact at the point of sutures.  He has an open wound between the 2nd & 3rd fingers.  The dirty dressing was removed from the pt's hand.

## 2016-12-28 NOTE — ED Notes (Signed)
Wound cleaned with saline, dried, and dressed with nonadhereant dressing and wrapped with kerlix.

## 2016-12-28 NOTE — ED Provider Notes (Signed)
  [facility]   [id number] 12/28/16 Arrival Time: 1112  ASSESSMENT & PLAN:  1. Laceration of left hand without complication, including fingers, initial encounter   2. Essential hypertension    Erythema due to inflammation. No signs of infection. Continue to watch.  Meds ordered this encounter  Medications  . hydrochlorothiazide (HYDRODIURIL) 25 MG tablet    Sig: Take 1 tablet (25 mg total) by mouth daily.    Dispense:  30 tablet    Refill:  0  . HYDROcodone-acetaminophen (NORCO/VICODIN) 5-325 MG tablet    Sig: Take 1 tablet by mouth every 6 (six) hours as needed for moderate pain or severe pain.    Dispense:  10 tablet    Refill:  0  . albuterol (PROVENTIL HFA;VENTOLIN HFA) 108 (90 Base) MCG/ACT inhaler    Sig: Inhale 2 puffs into the lungs every 4 (four) hours as needed for wheezing or shortness of breath.    Dispense:  1 Inhaler    Refill:  0   Pt requests alb inhaler. With hand still swollen, needs pain medication. Sedation precautions. Will take HCTZ for HTN. Recommend f/u 1-2 weeks for HTN. Continue wound care. Agrees to change bandage regularly.  Reviewed expectations re: course of current medical issues. Questions answered. Outlined signs and symptoms indicating need for more acute intervention. Patient verbalized understanding. After Visit Summary given.   SUBJECTIVE:  Juan Boyle is a 61 y.o. male who presents for recheck of hand wound. Seen in ED 2 days ago. Note reviewed. Still having significant discomfort. Has not changed bandage. Notice increased BP. Needs refill of HCTZ.   ROS: As per HPI.   OBJECTIVE:  Vitals:   12/28/16 1149  BP: (!) 165/85  Pulse: 79  Temp: 97.7 F (36.5 C)  TempSrc: Oral  SpO2: 99%     General appearance: alert; no distress Lungs: clear to auscultation bilaterally Heart: regular rate and rhythm Extremities: R hand wound without signs of infection; some erythema around stitches    Allergies  Allergen  Reactions  . Penicillins Anaphylaxis    Has patient had a PCN reaction causing immediate rash, facial/tongue/throat swelling, SOB or lightheadedness with hypotension: Yes Has patient had a PCN reaction causing severe rash involving mucus membranes or skin necrosis: Yes Has patient had a PCN reaction that required hospitalization: Yes Has patient had a PCN reaction occurring within the last 10 years: No If all of the above answers are "NO", then may proceed with Cephalosporin use.     PMHx, SurgHx, SocialHx, Medications, and Allergies were reviewed in the Visit Navigator and updated as appropriate.      Vanessa Kick, MD 01/01/17 [phone]

## 2017-01-03 ENCOUNTER — Inpatient Hospital Stay: Payer: Self-pay

## 2017-01-03 DIAGNOSIS — I10 Essential (primary) hypertension: Secondary | ICD-10-CM | POA: Insufficient documentation

## 2017-01-03 NOTE — Progress Notes (Deleted)
Patient ID: Juan Boyle, male   DOB: 1955-07-06, 61 y.o.   MRN: [id number] After being seen in the ED for R hand laceration on 12/26/2016.  Tdap was given.  BP was high.  #11 sutures were placed.  He was seen again in the ED on 12/28/2016 due to hand swelling and pain.  He was treated with pain meds and HCTZ was resumed for htn.

## 2017-02-05 DIAGNOSIS — I10 Essential (primary) hypertension: Secondary | ICD-10-CM | POA: Diagnosis not present

## 2017-02-05 DIAGNOSIS — J4520 Mild intermittent asthma, uncomplicated: Secondary | ICD-10-CM | POA: Diagnosis not present

## 2017-02-05 DIAGNOSIS — Z720 Tobacco use: Secondary | ICD-10-CM | POA: Diagnosis not present

## 2017-02-05 DIAGNOSIS — L84 Corns and callosities: Secondary | ICD-10-CM | POA: Diagnosis not present

## 2017-02-05 DIAGNOSIS — R358 Other polyuria: Secondary | ICD-10-CM | POA: Diagnosis not present

## 2017-02-16 MED FILL — HYDROCHLOROTHIAZIDE 25 MG T: 25 | 30 days supply | Qty: 30 | Fill #0

## 2017-03-09 DIAGNOSIS — I10 Essential (primary) hypertension: Secondary | ICD-10-CM | POA: Diagnosis not present

## 2017-03-09 DIAGNOSIS — J4520 Mild intermittent asthma, uncomplicated: Secondary | ICD-10-CM | POA: Diagnosis not present

## 2017-03-09 DIAGNOSIS — Z125 Encounter for screening for malignant neoplasm of prostate: Secondary | ICD-10-CM | POA: Diagnosis not present

## 2017-03-09 DIAGNOSIS — Z1211 Encounter for screening for malignant neoplasm of colon: Secondary | ICD-10-CM | POA: Diagnosis not present

## 2017-03-09 DIAGNOSIS — Z0000 Encounter for general adult medical examination without abnormal findings: Secondary | ICD-10-CM | POA: Diagnosis not present

## 2017-03-09 MED FILL — AMLODIPINE BESYLATE 10 MG T: 10 | 30 days supply | Qty: 30 | Fill #0

## 2017-03-12 MED FILL — HYDROCHLOROTHIAZIDE 25 MG T: 25 | 90 days supply | Qty: 90 | Fill #0

## 2017-04-10 DIAGNOSIS — I10 Essential (primary) hypertension: Secondary | ICD-10-CM | POA: Diagnosis not present

## 2017-04-10 DIAGNOSIS — J4520 Mild intermittent asthma, uncomplicated: Secondary | ICD-10-CM | POA: Diagnosis not present

## 2017-04-10 DIAGNOSIS — R739 Hyperglycemia, unspecified: Secondary | ICD-10-CM | POA: Diagnosis not present

## 2017-04-10 DIAGNOSIS — Z1211 Encounter for screening for malignant neoplasm of colon: Secondary | ICD-10-CM | POA: Diagnosis not present

## 2017-04-10 MED FILL — LISINOPRIL 10 MG TABS: 10 | 90 days supply | Qty: 90 | Fill #0

## 2017-04-10 MED FILL — AMLODIPINE BESYLATE 10 MG T: 10 | 90 days supply | Qty: 90 | Fill #0

## 2017-07-12 DIAGNOSIS — J4520 Mild intermittent asthma, uncomplicated: Secondary | ICD-10-CM | POA: Diagnosis not present

## 2017-07-12 DIAGNOSIS — I10 Essential (primary) hypertension: Secondary | ICD-10-CM | POA: Diagnosis not present

## 2019-04-21 ENCOUNTER — Telehealth: Payer: Self-pay | Admitting: *Deleted

## 2019-04-21 ENCOUNTER — Other Ambulatory Visit: Payer: Self-pay

## 2019-04-21 ENCOUNTER — Telehealth (HOSPITAL_COMMUNITY): Payer: Self-pay

## 2019-04-21 ENCOUNTER — Ambulatory Visit (INDEPENDENT_AMBULATORY_CARE_PROVIDER_SITE_OTHER): Payer: 59 | Admitting: Internal Medicine

## 2019-04-21 VITALS — BP 146/78 | HR 80 | Temp 98.4°F | Ht 59.0 in | Wt 133.0 lb

## 2019-04-21 DIAGNOSIS — M79671 Pain in right foot: Secondary | ICD-10-CM

## 2019-04-21 DIAGNOSIS — Z79899 Other long term (current) drug therapy: Secondary | ICD-10-CM

## 2019-04-21 DIAGNOSIS — E119 Type 2 diabetes mellitus without complications: Secondary | ICD-10-CM

## 2019-04-21 DIAGNOSIS — I10 Essential (primary) hypertension: Secondary | ICD-10-CM | POA: Diagnosis not present

## 2019-04-21 DIAGNOSIS — F17210 Nicotine dependence, cigarettes, uncomplicated: Secondary | ICD-10-CM | POA: Diagnosis not present

## 2019-04-21 DIAGNOSIS — I998 Other disorder of circulatory system: Secondary | ICD-10-CM | POA: Diagnosis not present

## 2019-04-21 DIAGNOSIS — I7025 Atherosclerosis of native arteries of other extremities with ulceration: Secondary | ICD-10-CM

## 2019-04-21 DIAGNOSIS — E1152 Type 2 diabetes mellitus with diabetic peripheral angiopathy with gangrene: Secondary | ICD-10-CM

## 2019-04-21 DIAGNOSIS — H5789 Other specified disorders of eye and adnexa: Secondary | ICD-10-CM

## 2019-04-21 DIAGNOSIS — L97509 Non-pressure chronic ulcer of other part of unspecified foot with unspecified severity: Secondary | ICD-10-CM

## 2019-04-21 DIAGNOSIS — F1010 Alcohol abuse, uncomplicated: Secondary | ICD-10-CM

## 2019-04-21 DIAGNOSIS — L989 Disorder of the skin and subcutaneous tissue, unspecified: Secondary | ICD-10-CM | POA: Diagnosis not present

## 2019-04-21 LAB — POCT GLYCOSYLATED HEMOGLOBIN (HGB A1C): Hemoglobin A1C: 7.8 % — AB (ref 4.0–5.6)

## 2019-04-21 LAB — GLUCOSE, CAPILLARY: Glucose-Capillary: 254 mg/dL — ABNORMAL HIGH (ref 70–99)

## 2019-04-21 MED ORDER — ASPIRIN EC 81 MG PO TBEC: 81.0000 mg | DELAYED_RELEASE_TABLET | Freq: Every day | ORAL | 2 refills | Status: AC

## 2019-04-21 MED ORDER — AMLODIPINE BESYLATE 5 MG PO TABS: 5.0000 mg | ORAL_TABLET | Freq: Every day | ORAL | 1 refills | Status: DC

## 2019-04-21 MED ORDER — METFORMIN HCL 500 MG PO TABS: 500.0000 mg | ORAL_TABLET | Freq: Two times a day (BID) | ORAL | 7 refills | Status: DC

## 2019-04-21 MED ORDER — METFORMIN HCL 500 MG PO TABS: 1000.0000 mg | ORAL_TABLET | Freq: Two times a day (BID) | ORAL | 11 refills | Status: DC

## 2019-04-21 MED FILL — metFORMIN HCL 500 MG TABS: 500 | 45 days supply | Qty: 90 | Fill #0

## 2019-04-21 MED FILL — ASPIRIN ADULT LOW STRENGTH: 81 | 90 days supply | Qty: 90 | Fill #0

## 2019-04-21 MED FILL — AMLODIPINE BESYLATE 5 MG TA: 5 | 90 days supply | Qty: 90 | Fill #0

## 2019-04-21 NOTE — Telephone Encounter (Signed)
Pharm, at [facility] calls and ask for you to review the metformin script and send a new one that is correct

## 2019-04-21 NOTE — Telephone Encounter (Signed)

## 2019-04-21 NOTE — Progress Notes (Signed)
CC: Foot pain  HPI: [name] is a 63 y.o. M w/ PMH of tobacco use, alcohol abuse and hypertension who presents with complaints of foot pain. He states he has been in his usual state of health until about 2 months ago he developed soreness on the bottom of his foot worsened by prolonged duration of standing on his foot. He cannot recall if during the last 2 months the area of pain has progressed but mentions the severity of pain has stayed consistent. Mentions relief with alleivation. Denies any fevers, chills, nausea, vomiting.  Family History: Denies any family history of early cardiac disease or cancer  Social History: Mentions drinking 3 x ~30oz beer daily. Smokes about half pack daily. Denies any illicit substance use. Has a brother who is close by and helps take care of him.  Past Medical History:  Diagnosis Date  . Asthma   . Hypertension    Review of Systems: Review of Systems  Constitutional: Negative for chills, fever and malaise/fatigue.  Eyes: Negative for blurred vision, double vision, pain and discharge.  Respiratory: Negative for cough and shortness of breath.   Cardiovascular: Negative for chest pain and palpitations.  Gastrointestinal: Negative for constipation, diarrhea, nausea and vomiting.  Neurological: Negative for dizziness and headaches.     Physical Exam: Vitals:   04/21/19 0957 04/21/19 1053  BP: (!) 155/76 (!) 146/78  Pulse: 80   Temp: 98.4 F (36.9 C)   TempSrc: Oral   SpO2: 99%   Weight: 133 lb (60.3 kg)   Height: 4\' 11"  (1.499 m)     Physical Exam  Constitutional: He is oriented to person, place, and time. He appears well-developed and well-nourished. No distress.  HENT:  Mouth/Throat: Oropharynx is clear and moist.  Eyes: Conjunctivae are normal. Left eye exhibits no discharge.  Non-tender ~1cm diameter mass on lateral side of left eye pictured below  Neck: Normal range of motion. Neck supple.  Cardiovascular: Normal rate,  regular rhythm and normal heart sounds.  No murmur heard. 2+  Pulses on LLE. Minimally palpable pulses on RLE  Respiratory: Effort normal and breath sounds normal. He has no wheezes. He has no rales.  GI: Soft. Bowel sounds are normal. He exhibits no distension. There is no abdominal tenderness.  Musculoskeletal: Normal range of motion.        General: Tenderness (Husky appearing patchy ares on plantar surface of his right foot with tenderness to palpation. No warmth, no edema) present.  Neurological: He is alert and oriented to person, place, and time.  Skin: Skin is warm and dry.  Husky appearing area on plantar foot pictured below        Assessment & Plan:   Hypertension BP Readings from Last 3 Encounters:  04/21/19 (!) 146/78  12/28/16 (!) 165/85  12/27/16 (!) 156/90   Bp shown to be elevated on prior readings as well as today. Chart review shows previously prescribed HCTZ but currently not taking any medications. Will start with amlodipine and add on additional anti-hypertensives as needed.  - Start amlodipine 5mg  daily  Ischemic pain of foot, right Presents with subacute onset right foot pain. Exacerbated by ambulation. Alleviated by elevation and rest. On exam, husky appearing ares on Right plantar surface. Significantly diminished pulses on RLE. POC ABI unable to detect pulses on Right leg only. Left leg with ABI 1.12. Discussed need for urgent referral to vascular surgery. [name] expressed understanding.  - Start aspirin 81mg  daily - Lipid panel - Hgb  a1c - Cbc  T2DM (type 2 diabetes mellitus) ([place]) Lab Results  Component Value Date   HGBA1C 7.8 (A) 04/21/2019   Point of care hemoglobin a1c drawn while assessing for risk factors for vascular disease found to be elevated at 7.8. Not on any diabetic meds. Denies any polyuria, polyphagia, polydipsia, blurry vision or peripheral numbness  - Start metformin 500mg  BID - Instructed to start checking blood sugars at home   Mass of left eye Incidental finding of mass on his left eye. Denies any blurry vision or pain. States new onset since 6 months ago. Unable to describe if mass has been growing recently or not. States 'it doesn't bother him.' On exam, not contiguous with the eyelid but unable to determine origin of mass. Differential includes sarcoma vs inclusion cyst?   - Referral for ophthalmology made - Given instructions on red flags and return to clinic / go to ED if symptoms worsen  Alcohol use disorder, mild, abuse On history taking, endorsing significant daily alcohol use at 3x 30oz beer daily. Denies any prior episodes of withdrawal episodes but states he has not thought about reducing his alcohol use. No evidence of prior screening for liver function on chart review.  - Advised on reducing alcohol intake - CMP today    Patient seen with Dr. Daryll Boyle   -Juan Boyle, PGY2 [facility] Pager: [phone]

## 2019-04-21 NOTE — Patient Instructions (Addendum)
Dear [name],  Thank you for allowing [place] to provide your care today. Today we discussed your foot pain and eye lesion    I have ordered cbc, cmp, a1c labs for you. I will call if any are abnormal.    Today we made the following changes to your medications:    Please start amlodipine 5mg  daily  Please follow-up in 3 months.    Should you have any questions or concerns please call the internal medicine clinic at [phone].    Thank you for choosing [facility].   Foot Care, Adult Foot care is an important part of your health. Noticing and addressing any potential problems early is the best way to prevent future foot problems. How to care for your [place] your feet daily with warm water and mild soap. Do not use hot water. Then, pat your feet and the areas between your toes until they are completely dry. Do not soak your feet as this can dry your skin.  Trim your toenails straight across. Do not dig under them or around the cuticle. File the edges of your nails with an emery board or nail file.  On the skin on your feet and on dry, brittle nails, apply a moisturizing lotion or petroleum jelly that is unscented and does not contain alcohol. Do not apply lotion between your toes. Shoes and Socks   Wear clean socks or stockings every day. Make sure they are not too tight.  Wear shoes that fit properly and have enough cushioning. To break in new shoes, wear them for just a few hours a day. This prevents you from injuring your feet. Always look in your shoes before you put them on to be sure there are no objects inside. Wounds, Scrapes, Corns, and Calluses  Check your feet daily for blisters, cuts, and redness. If you cannot see the bottom of your feet, use a mirror or ask someone for help.  Do not cut corns or calluses. Do not try to remove them with medicine.  If you find a minor scrape, cut, or break in the skin on your feet, keep it and the skin around  it clean and dry. These areas may be cleaned with mild soap and water. Do not clean the area with peroxide, alcohol, or iodine.  If you have a wound, scrape, corn, or callus on your foot, look at it several times a day to make sure it is healing and is not infected. Check for: ? More redness, swelling, or pain. ? More fluid or blood. ? Warmth. ? Pus or a bad smell. General Instructions  Do not cross your legs. That may decrease the blood flow to your feet.  Do not use heating pads or hot water bottles on your feet. They may burn your skin. If you have lost feeling in your feet or legs, you may not know it is happening until it is too late.  Make sure your health care provider does a complete foot exam at least annually or more often if you have foot problems. If you have foot problems, report any cuts, sores, or bruises to your health care provider immediately. Contact a health care provider if:  You have a medical condition that increases your risk of infection and you have any cuts, sores, or bruises on your feet.  You have an injury that is not healing.  You notice redness on your legs or feet.  You feel burning or  tingling in your legs or feet.  You have pain or cramps in your legs or feet.  Your legs or feet are numb.  Your feet always feel cold.  You have pain around a toenail. Get help right away if:  You have a wound, scrape, corn, or callus on your foot and: ? You have more redness, swelling, or pain. ? You have more fluid or blood. ? Your wound, scrape, corn, or callus feels warm to the touch. ? You have pus or a bad smell coming from the wound, scrape, corn, or callus. ? You have a fever.  You have a red line going up your leg. This information is not intended to replace advice given to you by your health care provider. Make sure you discuss any questions you have with your health care provider. Document Released: 11/05/2015 Document Revised: 06/15/2016 Document  Reviewed: 11/05/2015 Elsevier Patient Education  2020 [employer].

## 2019-04-21 NOTE — Telephone Encounter (Signed)
New script sent. Thank you

## 2019-04-22 ENCOUNTER — Encounter: Payer: Self-pay | Admitting: Internal Medicine

## 2019-04-22 ENCOUNTER — Ambulatory Visit (INDEPENDENT_AMBULATORY_CARE_PROVIDER_SITE_OTHER): Payer: 59 | Admitting: Vascular Surgery

## 2019-04-22 ENCOUNTER — Ambulatory Visit: Payer: 59 | Admitting: Dietician

## 2019-04-22 ENCOUNTER — Encounter: Payer: Self-pay | Admitting: Dietician

## 2019-04-22 ENCOUNTER — Ambulatory Visit (INDEPENDENT_AMBULATORY_CARE_PROVIDER_SITE_OTHER): Payer: 59 | Admitting: Internal Medicine

## 2019-04-22 ENCOUNTER — Other Ambulatory Visit: Payer: Self-pay | Admitting: Dietician

## 2019-04-22 ENCOUNTER — Encounter: Payer: Self-pay | Admitting: Vascular Surgery

## 2019-04-22 ENCOUNTER — Ambulatory Visit (HOSPITAL_COMMUNITY)
Admission: RE | Admit: 2019-04-22 | Discharge: 2019-04-22 | Disposition: A | Discharge status: Home / Self Care | Payer: 59 | Source: Ambulatory Visit | Attending: Vascular Surgery | Admitting: Vascular Surgery

## 2019-04-22 VITALS — BP 144/82

## 2019-04-22 VITALS — BP 182/101 | HR 77 | Temp 97.4°F | Resp 20 | Ht 59.0 in | Wt 130.6 lb

## 2019-04-22 DIAGNOSIS — F1010 Alcohol abuse, uncomplicated: Secondary | ICD-10-CM | POA: Insufficient documentation

## 2019-04-22 DIAGNOSIS — L97509 Non-pressure chronic ulcer of other part of unspecified foot with unspecified severity: Secondary | ICD-10-CM

## 2019-04-22 DIAGNOSIS — E1152 Type 2 diabetes mellitus with diabetic peripheral angiopathy with gangrene: Secondary | ICD-10-CM

## 2019-04-22 DIAGNOSIS — M25571 Pain in right ankle and joints of right foot: Secondary | ICD-10-CM

## 2019-04-22 DIAGNOSIS — H579 Unspecified disorder of eye and adnexa: Secondary | ICD-10-CM

## 2019-04-22 DIAGNOSIS — E119 Type 2 diabetes mellitus without complications: Secondary | ICD-10-CM | POA: Insufficient documentation

## 2019-04-22 DIAGNOSIS — Z7984 Long term (current) use of oral hypoglycemic drugs: Secondary | ICD-10-CM

## 2019-04-22 DIAGNOSIS — H5789 Other specified disorders of eye and adnexa: Secondary | ICD-10-CM | POA: Insufficient documentation

## 2019-04-22 DIAGNOSIS — M79671 Pain in right foot: Secondary | ICD-10-CM | POA: Insufficient documentation

## 2019-04-22 DIAGNOSIS — I998 Other disorder of circulatory system: Secondary | ICD-10-CM

## 2019-04-22 DIAGNOSIS — I7025 Atherosclerosis of native arteries of other extremities with ulceration: Secondary | ICD-10-CM

## 2019-04-22 DIAGNOSIS — I10 Essential (primary) hypertension: Secondary | ICD-10-CM | POA: Diagnosis not present

## 2019-04-22 DIAGNOSIS — Z720 Tobacco use: Secondary | ICD-10-CM

## 2019-04-22 DIAGNOSIS — E1151 Type 2 diabetes mellitus with diabetic peripheral angiopathy without gangrene: Secondary | ICD-10-CM | POA: Diagnosis not present

## 2019-04-22 LAB — LIPID PANEL
Chol/HDL Ratio: 2.5 ratio (ref 0.0–5.0)
Cholesterol, Total: 128 mg/dL (ref 100–199)
HDL: 52 mg/dL (ref >39)
LDL Chol Calc (NIH): 53 mg/dL (ref 0–99)
Triglycerides: 133 mg/dL (ref 0–149)
VLDL Cholesterol Cal: 23 mg/dL (ref 5–40)

## 2019-04-22 LAB — CMP14 + ANION GAP
ALT: 28 IU/L (ref 0–44)
AST: 23 IU/L (ref 0–40)
Albumin/Globulin Ratio: 1.4 (ref 1.2–2.2)
Albumin: 4.2 g/dL (ref 3.8–4.8)
Alkaline Phosphatase: 86 IU/L (ref 39–117)
Anion Gap: 13 mmol/L (ref 10.0–18.0)
BUN/Creatinine Ratio: 16 (ref 10–24)
BUN: 17 mg/dL (ref 8–27)
Bilirubin Total: 0.7 mg/dL (ref 0.0–1.2)
CO2: 26 mmol/L (ref 20–29)
Calcium: 8.9 mg/dL (ref 8.6–10.2)
Chloride: 100 mmol/L (ref 96–106)
Creatinine, Ser: 1.06 mg/dL (ref 0.76–1.27)
GFR calc Af Amer: 87 mL/min/{1.73_m2} (ref >59)
GFR calc non Af Amer: 75 mL/min/{1.73_m2} (ref >59)
Globulin, Total: 2.9 g/dL (ref 1.5–4.5)
Glucose: 263 mg/dL — ABNORMAL HIGH (ref 65–99)
Potassium: 4.7 mmol/L (ref 3.5–5.2)
Sodium: 139 mmol/L (ref 134–144)
Total Protein: 7.1 g/dL (ref 6.0–8.5)

## 2019-04-22 LAB — CBC
Hematocrit: 47.4 % (ref 37.5–51.0)
Hemoglobin: 15.8 g/dL (ref 13.0–17.7)
MCH: 29.9 pg (ref 26.6–33.0)
MCHC: 33.3 g/dL (ref 31.5–35.7)
MCV: 90 fL (ref 79–97)
Platelets: 300 10*3/uL (ref 150–450)
RBC: 5.28 x10E6/uL (ref 4.14–5.80)
RDW: 12.8 % (ref 11.6–15.4)
WBC: 7.4 10*3/uL (ref 3.4–10.8)

## 2019-04-22 MED ORDER — BLOOD GLUCOSE MONITOR KIT: PACK | 0 refills | Status: AC

## 2019-04-22 NOTE — Assessment & Plan Note (Signed)
Incidental finding of mass on his left eye. Denies any blurry vision or pain. States new onset since 6 months ago. Unable to describe if mass has been growing recently or not. States 'it doesn't bother him.' On exam, not contiguous with the eyelid but unable to determine origin of mass. Differential includes sarcoma vs inclusion cyst?   - Referral for ophthalmology made - Given instructions on red flags and return to clinic / go to ED if symptoms worsen

## 2019-04-22 NOTE — Assessment & Plan Note (Signed)
On history taking, endorsing significant daily alcohol use at 3x 30oz beer daily. Denies any prior episodes of withdrawal episodes but states he has not thought about reducing his alcohol use. No evidence of prior screening for liver function on chart review.  - Advised on reducing alcohol intake - CMP today

## 2019-04-22 NOTE — Progress Notes (Signed)
[facility] [place]  Patient name: Juan Boyle MRN: [id number] DOB: 01/20/1956 Sex: male  REASON FOR CONSULT: Evaluation right foot pain  HPI: Juan Boyle is a 63 y.o. male, who is here today for evaluation of right foot pain.  He is a very poor historian.  He does report pain specifically in his foot and reports that this is worse with walking.  He has a friend here with him who reports that this is mainly with walking and seems to be attributable to the extensive thickening of callus on the base of his foot.  He does not have any claudication type symptoms.  No history of stroke.  Past Medical History:  Diagnosis Date  . Asthma   . Hypertension     History reviewed. No pertinent family history.  SOCIAL HISTORY: Social History   Socioeconomic History  . Marital status: Single    Spouse name: Not on file  . Number of children: Not on file  . Years of education: Not on file  . Highest education level: Not on file  Occupational History  . Not on file  Social Needs  . Financial resource strain: Not on file  . Food insecurity    Worry: Not on file    Inability: Not on file  . Transportation needs    Medical: Not on file    Non-medical: Not on file  Tobacco Use  . Smoking status: Current Every Day Smoker    Packs/day: 0.50    Years: 40.00    Pack years: 20.00  . Smokeless tobacco: Never Used  Substance and Sexual Activity  . Alcohol use: Yes    Alcohol/week: 21.0 standard drinks    Types: 21 Cans of beer per week  . Drug use: No  . Sexual activity: Not on file  Lifestyle  . Physical activity    Days per week: Not on file    Minutes per session: Not on file  . Stress: Not on file  Relationships  . Social Herbalist on phone: Not on file    Gets together: Not on file    Attends religious service: Not on file    Active member of club or organization: Not on file    Attends meetings of  clubs or organizations: Not on file    Relationship status: Not on file  . Intimate partner violence    Fear of current or ex partner: Not on file    Emotionally abused: Not on file    Physically abused: Not on file    Forced sexual activity: Not on file  Other Topics Concern  . Not on file  Social History Narrative  . Not on file    Allergies  Allergen Reactions  . Penicillins Anaphylaxis    Has patient had a PCN reaction causing immediate rash, facial/tongue/throat swelling, SOB or lightheadedness with hypotension: Yes Has patient had a PCN reaction causing severe rash involving mucus membranes or skin necrosis: Yes Has patient had a PCN reaction that required hospitalization: Yes Has patient had a PCN reaction occurring within the last 10 years: No If all of the above answers are "NO", then may proceed with Cephalosporin use.     Current Outpatient Medications  Medication Sig Dispense Refill  . amLODipine (NORVASC) 5 MG tablet Take 1 tablet (5 mg total) by mouth daily. 90 tablet 1  . aspirin EC 81 MG tablet Take 1 tablet (81 mg total) by  mouth daily. 150 tablet 2  . metFORMIN (GLUCOPHAGE) 500 MG tablet Take 1 tablet (500 mg total) by mouth 2 (two) times daily with a meal. 90 tablet 7   No current facility-administered medications for this visit.     REVIEW OF SYSTEMS:  [X]  denotes positive finding, [ ]  denotes negative finding Cardiac  Comments:  Chest pain or chest pressure:    Shortness of breath upon exertion:    Short of breath when lying flat:    Irregular heart rhythm:        Vascular    Pain in calf, thigh, or hip brought on by ambulation:    Pain in feet at night that wakes you up from your sleep:  x   Blood clot in your veins:    Leg swelling:         Pulmonary    Oxygen at home:    Productive cough:     Wheezing:         Neurologic    Sudden weakness in arms or legs:  x   Sudden numbness in arms or legs:     Sudden onset of difficulty speaking or  slurred speech:    Temporary loss of vision in one eye:     Problems with dizziness:         Gastrointestinal    Blood in stool:     Vomited blood:         Genitourinary    Burning when urinating:     Blood in urine:        Psychiatric    Major depression:         Hematologic    Bleeding problems: x   Problems with blood clotting too easily:        Skin    Rashes or ulcers:        Constitutional    Fever or chills:      PHYSICAL EXAM: Vitals:   04/22/19 0848  BP: (!) 182/101  Pulse: 77  Resp: 20  Temp: (!) 97.4 F (36.3 C)  SpO2: 99%  Weight: 130 lb 9.6 oz (59.2 kg)  Height: 4\' 11"  (1.499 m)    GENERAL: The patient is a well-nourished male, in no acute distress. The vital signs are documented above. CARDIOVASCULAR: Carotid arteries without bruits bilaterally.  2+ radial pulses, 2+ femoral pulses, 2+ popliteal pulses bilaterally.  He does have a 2+ left posterior tibial pulse and a 1-2+ right dorsalis pedis pulse. PULMONARY: There is good air exchange  ABDOMEN: Soft and non-tender  MUSCULOSKELETAL: There are no major deformities or cyanosis. NEUROLOGIC: No focal weakness or paresthesias are detected. SKIN: There are no ulcers or rashes noted.  Does have extreme thickening and callus on the arch and area under his metatarsal heads in his right foot.  None on his left PSYCHIATRIC: The patient has a normal affect.  DATA:  Noninvasive studies today reveal normal ankle arm index on the left and slightly diminished on the right with normal toe brachial index.  Right ABI is 0.7 left is 1.0  MEDICAL ISSUES: Discussed these findings with patient.  He has mild right leg arterial insufficiency which does not correspond with resting symptoms and pain in his foot specifically with walking.  He will follow-up with internal medicine for potential podiatry referral and consultation.  Will see [place] again on an as-needed basis   Rosetta Posner, MD [facility]  of [facility] Tel [phone]  Pager [phone]

## 2019-04-22 NOTE — Patient Instructions (Addendum)
Dear Juan Boyle,  Thank you for allowing [place] to provide your care today. Today we discussed your foot pain and diabetes    I have ordered no labs for you. I will call if any are abnormal.    Today we made the following changes to your medications:    Please make sure to start metformin 500mg  BID. I have also made new order for glucometer for you. Make sure to check your blood sugar regularly  Please follow-up in 3 months.    Should you have any questions or concerns please call the internal medicine clinic at [phone].    Thank you for choosing [facility].    Smoking Tobacco Information, Adult Smoking tobacco can be harmful to your health. Tobacco contains a poisonous (toxic), colorless chemical called nicotine. Nicotine is addictive. It changes the brain and can make it hard to stop smoking. Tobacco also has other toxic chemicals that can hurt your body and raise your risk of many cancers. How can smoking tobacco affect me? Smoking tobacco puts you at risk for:  Cancer. Smoking is most commonly associated with lung cancer, but can also lead to cancer in other parts of the body.  Chronic obstructive pulmonary disease (COPD). This is a long-term lung condition that makes it hard to breathe. It also gets worse over time.  High blood pressure (hypertension), heart disease, stroke, or heart attack.  Lung infections, such as pneumonia.  Cataracts. This is when the lenses in the eyes become clouded.  Digestive problems. This may include peptic ulcers, heartburn, and gastroesophageal reflux disease (GERD).  Oral health problems, such as gum disease and tooth loss.  Loss of taste and smell. Smoking can affect your appearance by causing:  Wrinkles.  Yellow or stained teeth, fingers, and fingernails. Smoking tobacco can also affect your social life, because:  It may be challenging to find places to smoke when away from home. Many workplaces, [employer], hotels, and  public places are tobacco-free.  Smoking is expensive. This is due to the cost of tobacco and the long-term costs of treating health problems from smoking.  Secondhand smoke may affect those around you. Secondhand smoke can cause lung cancer, breathing problems, and heart disease. Children of smokers have a higher risk for: ? Sudden infant death syndrome (SIDS). ? Ear infections. ? Lung infections. If you currently smoke tobacco, quitting now can help you:  Lead a longer and healthier life.  Look, smell, breathe, and feel better over time.  Save money.  Protect others from the harms of secondhand smoke. What actions can I take to prevent health problems? Quit smoking   Do not start smoking. Quit if you already do.  Make a plan to quit smoking and commit to it. Look for programs to help you and ask your health care provider for recommendations and ideas.  Set a date and write down all the reasons you want to quit.  Let your friends and family know you are quitting so they can help and support you. Consider finding friends who also want to quit. It can be easier to quit with someone else, so that you can support each other.  Talk with your health care provider about using nicotine replacement medicines to help you quit, such as gum, lozenges, patches, sprays, or pills.  Do not replace cigarette smoking with electronic cigarettes, which are commonly called e-cigarettes. The safety of e-cigarettes is not known, and some may contain harmful chemicals.  If you try to quit  but return to smoking, stay positive. It is common to slip up when you first quit, so take it one day at a time.  Be prepared for cravings. When you feel the urge to smoke, chew gum or suck on hard candy. Lifestyle  Stay busy and take care of your body.  Drink enough fluid to keep your urine pale yellow.  Get plenty of exercise and eat a healthy diet. This can help prevent weight gain after quitting.  Monitor  your eating habits. Quitting smoking can cause you to have a larger appetite than when you smoke.  Find ways to relax. Go out with friends or family to a movie or a restaurant where people do not smoke.  Ask your health care provider about having regular tests (screenings) to check for cancer. This may include blood tests, imaging tests, and other tests.  Find ways to manage your stress, such as meditation, yoga, or exercise. Where to find support To get support to quit smoking, consider:  Asking your health care provider for more information and resources.  Taking classes to learn more about quitting smoking.  Looking for local organizations that offer resources about quitting smoking.  Joining a support group for people who want to quit smoking in your local community.  Calling the smokefree.gov counselor helpline: 1-800-Quit-Now 703-699-6978) Where to find more information You may find more information about quitting smoking from:  HelpGuide.org: www.helpguide.org  https://hall.com/: smokefree.gov  American Lung Association: www.lung.org Contact a health care provider if you:  Have problems breathing.  Notice that your lips, nose, or fingers turn blue.  Have chest pain.  Are coughing up blood.  Feel faint or you pass out.  Have other health changes that cause you to worry. Summary  Smoking tobacco can negatively affect your health, the health of those around you, your finances, and your social life.  Do not start smoking. Quit if you already do. If you need help quitting, ask your health care provider.  Think about joining a support group for people who want to quit smoking in your local community. There are many effective programs that will help you to quit this behavior. This information is not intended to replace advice given to you by your health care provider. Make sure you discuss any questions you have with your health care provider. Document Released:  06/13/2016 Document Revised: 07/18/2017 Document Reviewed: 06/13/2016 Elsevier Patient Education  2020 [employer].

## 2019-04-22 NOTE — Assessment & Plan Note (Signed)
BP Readings from Last 3 Encounters:  04/21/19 (!) 146/78  12/28/16 (!) 165/85  12/27/16 (!) 156/90   Bp shown to be elevated on prior readings as well as today. Chart review shows previously prescribed HCTZ but currently not taking any medications. Will start with amlodipine and add on additional anti-hypertensives as needed.  - Start amlodipine 5mg  daily

## 2019-04-22 NOTE — Progress Notes (Signed)
   CC: Foot pain  HPI: Mr.Loreto INA STRAUSS is a 63 y.o. M w/ PMH of HTN, PAD, T2DM recently diagnosed, tobacco use and alcohol abuse who presents for f/u for foot pain. He was seen in the clinic yesterday and found to have abnormal ABI and was referred to vascular surgery who he was able to visit earlier this morning. He was found to have moderate peripheral artery disease of his RLE with ABI of 0.7 and was recommended to manage medically. He came back to [facility] for follow up. He presents with his brother who has better health literacy and is able to understand that need to f/u with podiatry and start taking his medications daily.   Past Medical History:  Diagnosis Date  . Asthma   . Hypertension     Review of Systems: Review of Systems  Constitutional: Negative for chills, fever and malaise/fatigue.  Eyes: Negative for blurred vision.  Respiratory: Negative for cough and shortness of breath.   Cardiovascular: Negative for chest pain, palpitations and leg swelling.  Gastrointestinal: Negative for constipation, diarrhea, nausea and vomiting.  Genitourinary: Negative for urgency.  Neurological: Negative for headaches.  All other systems reviewed and are negative.    Physical Exam: Vitals:   04/22/19 0949  BP: (!) 144/82    Physical Exam  Constitutional: He is oriented to person, place, and time. He appears well-developed and well-nourished. No distress.  HENT:  Mouth/Throat: Oropharynx is clear and moist.  Eyes:  Stable mass on lateral side of left eye  Neck: Normal range of motion. Neck supple.  Cardiovascular: Normal rate, regular rhythm, normal heart sounds and intact distal pulses.  No murmur heard. Respiratory: Effort normal and breath sounds normal. He has no wheezes. He has no rales.  GI: Soft. Bowel sounds are normal. He exhibits no distension. There is no abdominal tenderness.  Musculoskeletal: Normal range of motion.        General: Tenderness (Area of husky appearing  foot on plantar surface stable) present.  Neurological: He is alert and oriented to person, place, and time.  Skin: Skin is warm and dry.     Assessment & Plan:   T2DM (type 2 diabetes mellitus) ([place]) Mr.Petter and brother presents w/ questions regarding his new diagnosis of T2DM. Discussed in detail w/ Mr.Lines regarding importance of avoiding high sugar foods, such as soda, cookies, and re-emphasized need to start metformin and use glucometer. Diabetes educator in clinic provided thorough education on glucometer usage.  - C/w metformin 500mg  BID - Script for freestyle lite glucometer provided  Ischemic pain of foot, right Seen by Dr.Early with vascular surgery. ABI performed with vascular shows moderate severity PAD w/ ABi of 0.7. Recommend medical management and referral to podiatry. No significant change in pain since yesterday.  - C/w asa - Lipid panel shows ldl <70. Low ascvd score. No indication to start statin at this time - Strongly emphasized importance of smoking cessation - Not ready to stop at this time - Referral to podiatry placed    Patient discussed with Dr. Rebeca Alert   -Gilberto Better, [facility] Pager: [phone]

## 2019-04-22 NOTE — Assessment & Plan Note (Addendum)
Presents with subacute onset right foot pain. Exacerbated by ambulation. Alleviated by elevation and rest. On exam, husky appearing ares on Right plantar surface. Significantly diminished pulses on RLE. POC ABI unable to detect pulses on Right leg only. Left leg with ABI 1.12. Discussed need for urgent referral to vascular surgery. Mr.Seeney expressed understanding.  - Urgent referral to vascular surgery - Start aspirin 81mg  daily - Lipid panel - Hgb a1c - Cbc

## 2019-04-22 NOTE — Progress Notes (Signed)
Diabetes Self-Management Education  Visit Type: First/Initial  Appt. Start Time: 1005 Appt. End Time: 1025  04/22/2019  Juan Boyle, identified by name and date of birth, is a 63 y.o. male with a diagnosis of Diabetes: Type 2.   ASSESSMENT  Asked by Dr. Truman Hayward to see patient today for self monitoring of his glucose instruction. He needed repetition and anticipate he will need additional support to master the technique independently. He had a supportive family member in the room today who said he would support him, but unsure how often he is there with patient.   Diabetes Self-Management Education - 04/22/19 1000      Visit Information   Visit Type  First/Initial      Initial Visit   Diabetes Type  Type 2    Date Diagnosed  04/2019      Psychosocial Assessment   Self-management support  Family    Other persons present  Family Member      Pre-Education Assessment   Patient understands monitoring blood glucose, interpreting and using results  Needs Instruction      Complications   Last HgB A1C per patient/outside source  7.8 %    Fasting Blood glucose range (mg/dL)  130-179      Patient Education   Monitoring  Taught/evaluated SMBG meter.   freestyle lite, anticipate will need repetition of education     Individualized Goals (developed by patient)   Monitoring   test my blood glucose as discussed   per Dr. Truman Hayward     Outcomes   Expected Outcomes  Demonstrated interest in learning. Expect positive outcomes    Future DMSE  --   1 week   Program Status  Not Completed       Individualized Plan for Diabetes Self-Management Training:   Learning Objective:  Patient will have a greater understanding of diabetes self-management. Patient education plan is to attend individual and/or group sessions per assessed needs and concerns.   Plan:   There are no Patient Instructions on file for this visit.  Expected Outcomes:  Demonstrated interest in learning. Expect positive  outcomes  Education material provided: Diabetes Resources  If problems or questions, patient to contact team via:  Phone   Future DSME appointment: (1 week)  Debera Lat, RD 04/22/2019 10:46 AM.

## 2019-04-22 NOTE — Assessment & Plan Note (Addendum)
Lab Results  Component Value Date   HGBA1C 7.8 (A) 04/21/2019   Point of care hemoglobin a1c drawn while assessing for risk factors for vascular disease found to be elevated at 7.8. Not on any diabetic meds. Denies any polyuria, polyphagia, polydipsia, blurry vision or peripheral numbness  - Start metformin 500mg  BID - Instructed to start checking blood sugars at home

## 2019-04-23 ENCOUNTER — Encounter: Payer: Self-pay | Admitting: *Deleted

## 2019-04-23 ENCOUNTER — Encounter: Payer: Self-pay | Admitting: Internal Medicine

## 2019-04-23 DIAGNOSIS — H3581 Retinal edema: Secondary | ICD-10-CM | POA: Diagnosis not present

## 2019-04-23 DIAGNOSIS — E119 Type 2 diabetes mellitus without complications: Secondary | ICD-10-CM | POA: Diagnosis not present

## 2019-04-23 DIAGNOSIS — H3562 Retinal hemorrhage, left eye: Secondary | ICD-10-CM | POA: Diagnosis not present

## 2019-04-23 DIAGNOSIS — H2513 Age-related nuclear cataract, bilateral: Secondary | ICD-10-CM | POA: Diagnosis not present

## 2019-04-23 DIAGNOSIS — H0589 Other disorders of orbit: Secondary | ICD-10-CM | POA: Diagnosis not present

## 2019-04-23 LAB — HM DIABETES EYE EXAM

## 2019-04-23 NOTE — Assessment & Plan Note (Signed)
Seen by Dr.Early with vascular surgery. ABI performed with vascular shows moderate severity PAD w/ ABi of 0.7. Recommend medical management and referral to podiatry. No significant change in pain since yesterday.  - C/w asa - Lipid panel shows ldl <70. Low ascvd score. No indication to start statin at this time - Strongly emphasized importance of smoking cessation - Not ready to stop at this time - Referral to podiatry placed

## 2019-04-23 NOTE — Assessment & Plan Note (Signed)
Juan Boyle and brother presents w/ questions regarding his new diagnosis of T2DM. Discussed in detail w/ Mr.Code regarding importance of avoiding high sugar foods, such as soda, cookies, and re-emphasized need to start metformin and use glucometer. Diabetes educator in clinic provided thorough education on glucometer usage.  - C/w metformin 500mg  BID - Script for freestyle lite glucometer provided

## 2019-04-23 NOTE — Progress Notes (Signed)
Internal Medicine Clinic Attending  I saw and evaluated the patient.  I personally confirmed the key portions of the history and exam documented by Dr. Lee and I reviewed pertinent patient test results.  The assessment, diagnosis, and plan were formulated together and I agree with the documentation in the resident's note.  

## 2019-04-24 ENCOUNTER — Ambulatory Visit: Payer: 59 | Admitting: Podiatry

## 2019-04-24 ENCOUNTER — Ambulatory Visit (INDEPENDENT_AMBULATORY_CARE_PROVIDER_SITE_OTHER): Payer: 59

## 2019-04-24 ENCOUNTER — Other Ambulatory Visit: Payer: Self-pay

## 2019-04-24 ENCOUNTER — Encounter: Payer: Self-pay | Admitting: Podiatry

## 2019-04-24 ENCOUNTER — Other Ambulatory Visit: Payer: Self-pay | Admitting: Podiatry

## 2019-04-24 DIAGNOSIS — E1140 Type 2 diabetes mellitus with diabetic neuropathy, unspecified: Secondary | ICD-10-CM | POA: Diagnosis not present

## 2019-04-24 DIAGNOSIS — M79675 Pain in left toe(s): Secondary | ICD-10-CM | POA: Diagnosis not present

## 2019-04-24 DIAGNOSIS — M79674 Pain in right toe(s): Secondary | ICD-10-CM | POA: Diagnosis not present

## 2019-04-24 DIAGNOSIS — L84 Corns and callosities: Secondary | ICD-10-CM | POA: Diagnosis not present

## 2019-04-24 DIAGNOSIS — B351 Tinea unguium: Secondary | ICD-10-CM | POA: Diagnosis not present

## 2019-04-24 DIAGNOSIS — E1149 Type 2 diabetes mellitus with other diabetic neurological complication: Secondary | ICD-10-CM

## 2019-04-24 DIAGNOSIS — M79671 Pain in right foot: Secondary | ICD-10-CM

## 2019-04-24 NOTE — Progress Notes (Signed)
   Subjective:    Patient ID: Juan Boyle, male    DOB: [birth year], 63 y.o.   MRN: [id number]  HPI    Review of Systems  All other systems reviewed and are negative.      Objective:   Physical Exam        Assessment & Plan:

## 2019-04-25 ENCOUNTER — Encounter: Payer: Self-pay | Admitting: *Deleted

## 2019-04-25 NOTE — Progress Notes (Signed)
Internal Medicine Clinic Attending  Case discussed with Dr. Lee at the time of the visit.  We reviewed the resident's history and exam and pertinent patient test results.  I agree with the assessment, diagnosis, and plan of care documented in the resident's note.  Alexander Raines, M.D., Ph.D.  

## 2019-04-29 ENCOUNTER — Ambulatory Visit: Payer: 59 | Admitting: Dietician

## 2019-04-29 ENCOUNTER — Encounter: Payer: 59 | Admitting: Dietician

## 2019-04-29 NOTE — Progress Notes (Signed)
Subjective:   Patient ID: Juan Boyle, male   DOB: 63 y.o.   MRN: [id number]   HPI Patient present long-term diabetic who is in fair control who has severe nail disease 1-5 both feet with pain upon palpation and lesion plantar aspect both feet that he cannot take care of with at risk condition.  Patient does not smoke currently would like to be more active   Review of Systems  All other systems reviewed and are negative.       Objective:  Physical Exam Vitals signs and nursing note reviewed.  Constitutional:      Appearance: He is well-developed.  Pulmonary:     Effort: Pulmonary effort is normal.  Musculoskeletal: Normal range of motion.  Skin:    General: Skin is warm.  Neurological:     Mental Status: He is alert.     Neurovascular status found to be intact with moderate diminishment sharp dull vibratory.  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  Reappoint for [place] to recheck  X-rays indicate that there is

## 2019-05-06 ENCOUNTER — Encounter: Payer: 59 | Admitting: Dietician

## 2019-05-06 ENCOUNTER — Telehealth: Payer: Self-pay | Admitting: Dietician

## 2019-05-06 DIAGNOSIS — E1152 Type 2 diabetes mellitus with diabetic peripheral angiopathy with gangrene: Secondary | ICD-10-CM

## 2019-05-06 NOTE — Telephone Encounter (Signed)
Mr Klick had to work today per his brother. He rescheduled for next Monday. Lost meter prescription. Will request new prescription.

## 2019-05-12 ENCOUNTER — Encounter: Payer: Self-pay | Admitting: Dietician

## 2019-05-12 ENCOUNTER — Ambulatory Visit (INDEPENDENT_AMBULATORY_CARE_PROVIDER_SITE_OTHER): Payer: 59 | Admitting: Dietician

## 2019-05-12 DIAGNOSIS — E1152 Type 2 diabetes mellitus with diabetic peripheral angiopathy with gangrene: Secondary | ICD-10-CM | POA: Diagnosis not present

## 2019-05-12 DIAGNOSIS — Z713 Dietary counseling and surveillance: Secondary | ICD-10-CM | POA: Diagnosis not present

## 2019-05-12 DIAGNOSIS — Z6827 Body mass index (BMI) 27.0-27.9, adult: Secondary | ICD-10-CM | POA: Diagnosis not present

## 2019-05-12 LAB — GLUCOSE, CAPILLARY: Glucose-Capillary: 144 mg/dL — ABNORMAL HIGH (ref 70–99)

## 2019-05-12 MED ORDER — LANCETS 30G MISC: 5 refills | Status: AC

## 2019-05-12 MED ORDER — FREESTYLE LITE DEVI: 0 refills | Status: AC

## 2019-05-12 MED ORDER — FREESTYLE LITE TEST VI STRP: ORAL_STRIP | 12 refills | Status: AC

## 2019-05-12 MED FILL — FREESTYLE LITE METER: 30 days supply | Qty: 1 | Fill #0

## 2019-05-12 NOTE — Patient Instructions (Addendum)
You are doing a good job taking your morning metformin.   Please start taking the METFORMIN  two times a day with breakfast and dinner. This will help lower your blood sugar a bit more.   Pick up your Freestyle meter and strips for 20$ at the [facility].   You can start checking your blood sugar every morning BEFORE eating or drinking anything.   How do I check my blood sugar? After washing your hands, insert a test strip into your meter. 2. Use your lancing device on the side of your fingertip to get a drop of blood. 3. Touch and hold the edge of the test strip to the drop of blood and wait for the result. 4. Your blood glucose level will appear on the meter's display.   If you need help with your meter, you can call or come to our office.   Drink mostly water- at least 4-6 cups a day, 2 cups of milk and 1-2 cups each of tea and coffee, and maybe 1/2 cup juice per day   Butch Penny [phone]

## 2019-05-12 NOTE — Progress Notes (Signed)
Diabetes Self-Management Education  Visit Type:  Follow-up  Appt. Start Time: 0905 Appt. End Time: 4580  05/12/2019  [name], identified by name and date of birth, is a 63 y.o. male with a diagnosis of Diabetes:  .Type 2 ASSESSMENT  Meter is free strips are 40$ for 90 days, 20$ for 45 days. I was able to give him samples of strips and lancets. He only has to pick up his meter that is free today. Consider having him checking blood sugar as needed rather than regularly as this task is very difficult for him to master. Suggest focusing on healthy lifestyle (smoking, alcohol use, decreasing sugar intake, foot care)  and taking medications. Will discuss with Dr. Truman Hayward.  He picked up metformin on 04/22/19 90 tablets which is a 45 day supply.   Estimated body mass index is 27.06 kg/m as calculated from the following:   Height as of 04/22/19: _0  (1.499 m).   Weight as of this encounter: 134 lb (60.8 kg).    Diabetes Self-Management Education - 05/12/19 0900      Psychosocial Assessment   Self-care barriers  Low literacy;Debilitated state due to current medical condition;Lack of material resources    Special Needs  Simplified materials    Learning Readiness  Change in progress      Complications   How often do you check your blood sugar?  0 times/day (not testing)    Postprandial Blood glucose range (mg/dL)  130-179    Are you checking your feet?  No      Dietary Intake   Breakfast  2 hard boiled eggs    Lunch  grilled cheese or hot dog and regular 20 oz bottle of sprite    Dinner  fish and cole salw or baked chicken wingsx2, greens, mac & cheese and 1 slice white bread    Beverage(s)  water, regular soda, juice      Exercise   Exercise Type  ADL's      Individualized Goals (developed by patient)   Medications  take my medication as prescribed      Patient Self-Evaluation of Goals - Patient rates self as meeting previously set goals (% of time)   Monitoring  < 25%    having a difficult time learning to chekc his blood sugar     Outcomes   Program Status  Not Completed       Learning Objective:  Patient will have a greater understanding of diabetes self-management. Patient education plan is to attend individual and/or group sessions per assessed needs and concerns.   Plan:   Patient Instructions  You are doing a good job taking your morning metformin.   Please start taking the METFORMIN  two times a day with breakfast and dinner. This will help lower your blood sugar a bit more.   Pick up your Freestyle meter and strips for 20$ at the [facility].   You can start checking your blood sugar every morning BEFORE eating or drinking anything.   How do I check my blood sugar? After washing your hands, insert a test strip into your meter. 2. Use your lancing device on the side of your fingertip to get a drop of blood. 3. Touch and hold the edge of the test strip to the drop of blood and wait for the result. 4. Your blood glucose level will appear on the meter's display.   If you need help with your meter, you can call or  come to our office.   Drink mostly water- at least 4-6 cups a day, 2 cups of milk and 1-2 cups each of tea and coffee, and maybe 1/2 cup juice per day   Butch Penny [phone]   Expected Outcomes:  Demonstrated interest in learning. Expect positive outcomes Education material provided: Diabetes Resources  If problems or questions, patient to contact team via:  Phone  Future DSME appointment: - Other (comment)(3 weeks)  Recommending several short visits to review Butch Penny [phone]

## 2019-06-02 ENCOUNTER — Ambulatory Visit (INDEPENDENT_AMBULATORY_CARE_PROVIDER_SITE_OTHER): Payer: 59 | Admitting: Internal Medicine

## 2019-06-02 ENCOUNTER — Encounter: Payer: Self-pay | Admitting: Internal Medicine

## 2019-06-02 ENCOUNTER — Ambulatory Visit (INDEPENDENT_AMBULATORY_CARE_PROVIDER_SITE_OTHER): Payer: 59 | Admitting: Dietician

## 2019-06-02 ENCOUNTER — Encounter: Payer: Self-pay | Admitting: Dietician

## 2019-06-02 ENCOUNTER — Other Ambulatory Visit: Payer: Self-pay

## 2019-06-02 VITALS — BP 162/71 | HR 64 | Temp 98.3°F | Ht 59.0 in | Wt 127.7 lb

## 2019-06-02 DIAGNOSIS — H539 Unspecified visual disturbance: Secondary | ICD-10-CM | POA: Diagnosis not present

## 2019-06-02 DIAGNOSIS — E1152 Type 2 diabetes mellitus with diabetic peripheral angiopathy with gangrene: Secondary | ICD-10-CM | POA: Diagnosis not present

## 2019-06-02 DIAGNOSIS — Z7984 Long term (current) use of oral hypoglycemic drugs: Secondary | ICD-10-CM | POA: Diagnosis not present

## 2019-06-02 DIAGNOSIS — I10 Essential (primary) hypertension: Secondary | ICD-10-CM | POA: Diagnosis not present

## 2019-06-02 DIAGNOSIS — Z79899 Other long term (current) drug therapy: Secondary | ICD-10-CM

## 2019-06-02 DIAGNOSIS — E119 Type 2 diabetes mellitus without complications: Secondary | ICD-10-CM | POA: Diagnosis not present

## 2019-06-02 DIAGNOSIS — Z713 Dietary counseling and surveillance: Secondary | ICD-10-CM

## 2019-06-02 DIAGNOSIS — Z6825 Body mass index (BMI) 25.0-25.9, adult: Secondary | ICD-10-CM | POA: Diagnosis not present

## 2019-06-02 LAB — GLUCOSE, CAPILLARY: Glucose-Capillary: 116 mg/dL — ABNORMAL HIGH (ref 70–99)

## 2019-06-02 NOTE — Assessment & Plan Note (Signed)
Patient was seen 1.5 months ago for a L eye mass and was referred to ophthalmology for further evaluation.  Per Dr. Zenia Resides note in the media tab this is orbital fat prolapse and no intervention is indicated unless it is bothersome to the patient at which time he was referred to oculoplastics.  He presents today complaining of vision changes in his L eye, namely seeing red, yellow, and green colors especially in the morning.  He denies pain in the eye or a foreign body sensation.  No blurry vision or vision loss.  He denies changes in vision in his right eye.  He thought this was a side effect from one of his medications and stopped taking all of them 3 weeks ago.  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

## 2019-06-02 NOTE — Patient Instructions (Signed)
Mr. Frederique,   Please start taking all your medications again.   Amlodipine 5 mg daily  Metformin 500 mg twice a day  Aspirin 81 mg daily   The changes in your vision could be from the fat deposits in your eyes and due to uncontrolled blood pressure. You can follow up with your eye doctor for a repeat eye exam if the vision changes are bothering you too much.   - Dr. Frederico Hamman

## 2019-06-02 NOTE — Assessment & Plan Note (Signed)
BP uncontrolled after stopping medications 3 weeks ago.  Will resume amlodipine 5 mg daily.

## 2019-06-02 NOTE — Progress Notes (Signed)
Internal Medicine Clinic Attending  Case discussed with Dr. Santos-Sanchez at the time of the visit.  We reviewed the resident's history and exam and pertinent patient test results.  I agree with the assessment, diagnosis, and plan of care documented in the resident's note.    

## 2019-06-02 NOTE — Assessment & Plan Note (Signed)
Resume Metformin 500 mg twice daily.

## 2019-06-02 NOTE — Progress Notes (Signed)
   CC: Vision changes  HPI:  [name] is a 63 y.o. year-old male with PMH listed below who presents to clinic for vision changes. Please see problem based assessment and plan for further details.   Past Medical History:  Diagnosis Date  . Asthma   . Hypertension    Review of Systems:   Review of Systems  Constitutional: Negative for chills, fever, malaise/fatigue and weight loss.  Eyes: Negative for blurred vision, double vision, photophobia, pain, discharge and redness.    Physical Exam:  Vitals:   06/02/19 0942  BP: (!) 162/71  Pulse: 64  Temp: 98.3 F (36.8 C)  TempSrc: Oral  SpO2: 100%  Weight: 127 lb 11.2 oz (57.9 kg)  Height: 4\' 11"  (1.499 m)    General: Well-appearing male in no acute distress Eyes: there is lateral scleral mass on the L eye without associated irritation, redness, pain, discharge or blurry vision. PERRL.R eye unremarkable.   Assessment & Plan:   See Encounters Tab for problem based charting.  Patient discussed with Dr. Angelia Mould

## 2019-06-02 NOTE — Progress Notes (Signed)
Diabetes Self-Management Education  Visit Type: Follow-up  Appt. Start Time: 0919 Appt. End Time: [id number]  06/02/2019  [name], identified by name and date of birth, is a 63 y.o. male with a diagnosis of Diabetes:  Marland Kitchen Type 2  ASSESSMENT medicine making him dizzy, was seeing red and green, so stopped all medicines about 2-3 weeks ago. Patient was made an appointment to see the doctor today to discuss this  24-hr recall suggests intake of ~1800 kcal:  (Up at  8-9 AM) B ( 03-1029 AM)- pancake x1 syrup, hot chocolate x1  L (2-3 PM)- pork skins, beer bottles corona x3  D ( 6PM)- bologna and cheese sandwich, bottle Gatorade medium sized  Drinks gatorade and water Typical day? Yes on a day off from work.    Bedtime 10 PM  Smoking- says he is ready to quit and will do on his own.  Weight 127 lb 11.2 oz (57.9 kg). Body mass index is 25.79 kg/m.  Diabetes Self-Management Education - 06/02/19 1000      Visit Information   Visit Type  Follow-up      Health Coping   How would you rate your overall health?  Good      Complications   How often do you check your blood sugar?  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  No    Since your last visit have you had your blood pressure checked?  No    Since your last visit have you experienced any weight changes?  Loss    Weight Loss (lbs)  2    Since your last  visit, are you checking your blood glucose at least once a day?  No       Individualized Plan for Diabetes Self-Management Training:   Learning Objective:  Patient will have a greater understanding of diabetes self-management. Patient education plan is to attend individual and/or group sessions per assessed needs and concerns.   Plan:   There are no Patient Instructions on file for this visit.  Expected Outcomes:     Education material provided: Diabetes Resources  If problems or questions, patient to contact team via:  Phone  Future DSME appointment:   4 weeks Debera Lat, RD 06/02/2019 10:31 AM.  .

## 2019-06-02 NOTE — Patient Instructions (Addendum)
Please try to quit smoking.     Quitting Smoking  Quitting smoking is a physical and mental challenge. You will face cravings, withdrawal symptoms, and temptation. Before quitting, work with your health care provider to make a plan that can help you cope. Preparation can help you quit and keep you from giving in. How can I cope with cravings? Cravings usually last for 5-10 minutes. If you get through it, the craving will pass. Consider taking the following actions to help you cope with cravings:  Keep your mouth busy: ? Chew sugar-free gum. ? Suck on hard candies or a straw. ? Brush your teeth.  Keep your hands and body busy: ? Immediately change to a different activity when you feel a craving. ? Squeeze or play with a ball ? . ? Do an activity or a hobby, like making bead jewelry, practicing needlepoint, or working with wood. ?  ? Take a short exercise break. Go for a quick walk or run up and down stairs. ?    Call a friend or family member to talk during a craving.   Talk with your health care provider about medicines that might help you cope with cravings and make quitting easier for you.  How can I handle social situations? Social situations can be difficult when you are quitting smoking, especially in the first few weeks. To manage this, you can:   Avoid alcohol.  Leave right away if you have the urge to smoke.  Explain to your family and friends that you are quitting smoking. Ask for understanding and support.  Plan activities with friends or family where smoking is not an option.   What are some ways I can prevent weight gain? ?   Exercising regularly: ? Make time to exercise each day. If you do not have time for a long workout, do short bouts of exercise for 5-10 minutes several times a day. ? Do some form of strengthening exercise, like weight lifting, and some form of aerobic exercise, like running or swimming.  Drinking plenty of water or other low-calorie  or no-calorie drinks. Drink 6-8 glasses of water daily, or as much as instructed by your health care provider. Summary   Ask your health care provider about the different ways to prevent weight gain, avoid stress, and handle social situations.

## 2019-06-30 ENCOUNTER — Ambulatory Visit: Payer: 59 | Admitting: Dietician

## 2019-07-07 ENCOUNTER — Encounter: Payer: Self-pay | Admitting: Internal Medicine

## 2019-07-07 ENCOUNTER — Other Ambulatory Visit: Payer: Self-pay

## 2019-07-07 ENCOUNTER — Other Ambulatory Visit: Payer: 59

## 2019-07-07 ENCOUNTER — Ambulatory Visit (INDEPENDENT_AMBULATORY_CARE_PROVIDER_SITE_OTHER): Payer: 59 | Admitting: Internal Medicine

## 2019-07-07 ENCOUNTER — Encounter (INDEPENDENT_AMBULATORY_CARE_PROVIDER_SITE_OTHER): Payer: Self-pay

## 2019-07-07 VITALS — BP 167/95 | HR 78 | Temp 98.4°F | Ht 59.0 in | Wt 131.4 lb

## 2019-07-07 DIAGNOSIS — E118 Type 2 diabetes mellitus with unspecified complications: Secondary | ICD-10-CM | POA: Diagnosis not present

## 2019-07-07 DIAGNOSIS — I10 Essential (primary) hypertension: Secondary | ICD-10-CM

## 2019-07-07 DIAGNOSIS — H9191 Unspecified hearing loss, right ear: Secondary | ICD-10-CM | POA: Diagnosis not present

## 2019-07-07 DIAGNOSIS — Z7984 Long term (current) use of oral hypoglycemic drugs: Secondary | ICD-10-CM | POA: Diagnosis not present

## 2019-07-07 DIAGNOSIS — Z9114 Patient's other noncompliance with medication regimen: Secondary | ICD-10-CM | POA: Diagnosis not present

## 2019-07-07 DIAGNOSIS — E1152 Type 2 diabetes mellitus with diabetic peripheral angiopathy with gangrene: Secondary | ICD-10-CM

## 2019-07-07 DIAGNOSIS — Z79899 Other long term (current) drug therapy: Secondary | ICD-10-CM

## 2019-07-07 NOTE — Progress Notes (Signed)
   CC: Hearing loss, DM, HTN  HPI:  Juan Boyle is a 64 y.o. male with PMHx listed below presenting for Hearing loss, DM, HTN. Please see the A&P for the status of the patient's chronic medical problems.  Past Medical History:  Diagnosis Date  . Asthma   . Hypertension    Review of Systems:  Performed and all others negative.  Physical Exam: Vitals:   07/07/19 0836  BP: (!) 167/95  Pulse: 78  Temp: 98.4 F (36.9 C)  TempSrc: Oral  SpO2: 100%  Weight: 131 lb 6.4 oz (59.6 kg)  Height: 4\' 11"  (1.499 m)   General: Well nourished male in no acute distress Pulm: Good air movement with no wheezing or crackles  CV: RRR, no murmurs, no rubs   Assessment & Plan:   See Encounters Tab for problem based charting.  Patient discussed with Dr. Daryll Drown

## 2019-07-07 NOTE — Assessment & Plan Note (Signed)
Patient with uncontrolled hypertension. Last seen in the clinic on 12/21. At that time he was started on amlodipine 5 mg once daily. He had previously been on hydrochlorothiazide 25 mg once daily.  Since his last visit he is discontinue this medication. He states that he was having adverse effects to it. He describes visual changes. Since stopping all his medications his visual changes have subsided. He is otherwise doing well. We discussed why we treat hypertension. He voices understanding.  A/P: - Patient to restart his metformin. Will come back on Feb 9th for PCP visit. At that point BP medications can be started.

## 2019-07-07 NOTE — Patient Instructions (Signed)
Thank you for allowing [place] to provide your care. Today I would like you to restart your metformin. I do not think this is causing your visual issues. I would like you to come back in two weeks for repeat blood pressure check.  For your hearing I am referring you on to an ear nose and throat specialist. They will be able to help [place] determine what is causing your hearing loss on the right.  If you have any questions or concerns please do not hesitate to call us.

## 2019-07-07 NOTE — Assessment & Plan Note (Signed)
Patient with uncontrolled type II diabetes. He is prescribed metformin 500 mg twice daily. He states that he stopped this medication a couple weeks ago due to visual changes. He is not restarted any of his medications.  A/P: - Restart Metformin 500 mg BID  - Come back on Feb 9th for repeat assessment

## 2019-07-07 NOTE — Assessment & Plan Note (Signed)
Patient with a one year history of progressive unilateral hearing loss. Primarily the right. Denies tinnitus, headaches, vertigo, fullness in his ears. He does not take any over-the-counter supplements/herbs. He denies fevers, chills, weight loss.  On physical exam: - Left:  -- Rinne: Air > bone Right:  -- Rinne: Air > bone Weber: localizes to the right ear (affected ear) No cerumen impaction bilaterally.  A/P: - Patient with progressive unilateral hearing loss. His Weber and Rinne hear test in the office are inconsistent. No indications for advanced imaging at this point. Will refer to ENT for further evaluation.

## 2019-07-09 NOTE — Progress Notes (Signed)
Internal Medicine Clinic Attending  Case discussed with Dr. Helberg at the time of the visit.  We reviewed the resident's history and exam and pertinent patient test results.  I agree with the assessment, diagnosis, and plan of care documented in the resident's note.    

## 2019-07-14 ENCOUNTER — Ambulatory Visit: Payer: 59 | Admitting: Dietician

## 2019-07-15 ENCOUNTER — Other Ambulatory Visit: Payer: Self-pay

## 2019-07-15 ENCOUNTER — Emergency Department (HOSPITAL_COMMUNITY): Payer: 59

## 2019-07-15 ENCOUNTER — Emergency Department (HOSPITAL_COMMUNITY)
Admission: EM | Admit: 2019-07-15 | Discharge: 2019-07-15 | Disposition: A | Discharge status: Home / Self Care | Payer: 59 | Attending: Emergency Medicine | Admitting: Emergency Medicine

## 2019-07-15 ENCOUNTER — Encounter (HOSPITAL_COMMUNITY): Payer: Self-pay

## 2019-07-15 DIAGNOSIS — M79642 Pain in left hand: Secondary | ICD-10-CM

## 2019-07-15 DIAGNOSIS — Z7982 Long term (current) use of aspirin: Secondary | ICD-10-CM | POA: Diagnosis not present

## 2019-07-15 DIAGNOSIS — E119 Type 2 diabetes mellitus without complications: Secondary | ICD-10-CM | POA: Diagnosis not present

## 2019-07-15 DIAGNOSIS — F17210 Nicotine dependence, cigarettes, uncomplicated: Secondary | ICD-10-CM | POA: Insufficient documentation

## 2019-07-15 DIAGNOSIS — J45909 Unspecified asthma, uncomplicated: Secondary | ICD-10-CM | POA: Insufficient documentation

## 2019-07-15 DIAGNOSIS — I10 Essential (primary) hypertension: Secondary | ICD-10-CM | POA: Insufficient documentation

## 2019-07-15 DIAGNOSIS — Z7984 Long term (current) use of oral hypoglycemic drugs: Secondary | ICD-10-CM | POA: Insufficient documentation

## 2019-07-15 DIAGNOSIS — Z79899 Other long term (current) drug therapy: Secondary | ICD-10-CM | POA: Diagnosis not present

## 2019-07-15 MED ORDER — AMLODIPINE BESYLATE 5 MG PO TABS
5.0000 mg | ORAL_TABLET | Freq: Every day | ORAL | Status: DC
  Administered 2019-07-15: 13:00:00 5 mg via ORAL
  Filled 2019-07-15: qty 1
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
## 2019-07-15 NOTE — Discharge Instructions (Signed)
Take your home blood pressure medicines.  Warm soapy water run over your hand.  Place bacitracin to your wound.  Follow-up with primary care for reevaluation.  Return for any new or worsening symptoms.

## 2019-07-17 ENCOUNTER — Other Ambulatory Visit: Payer: Self-pay

## 2019-07-17 ENCOUNTER — Encounter: Payer: Self-pay | Admitting: Internal Medicine

## 2019-07-17 ENCOUNTER — Ambulatory Visit (INDEPENDENT_AMBULATORY_CARE_PROVIDER_SITE_OTHER): Payer: 59 | Admitting: Internal Medicine

## 2019-07-17 VITALS — BP 142/73 | HR 85 | Temp 97.4°F | Ht 59.0 in | Wt 129.5 lb

## 2019-07-17 DIAGNOSIS — I10 Essential (primary) hypertension: Secondary | ICD-10-CM

## 2019-07-17 DIAGNOSIS — E119 Type 2 diabetes mellitus without complications: Secondary | ICD-10-CM | POA: Diagnosis not present

## 2019-07-17 DIAGNOSIS — M25561 Pain in right knee: Secondary | ICD-10-CM

## 2019-07-17 DIAGNOSIS — G8911 Acute pain due to trauma: Secondary | ICD-10-CM | POA: Diagnosis not present

## 2019-07-17 MED ORDER — NAPROXEN 500 MG PO TABS: 500.0000 mg | ORAL_TABLET | Freq: Two times a day (BID) | ORAL | 0 refills | Status: DC

## 2019-07-17 MED FILL — NAPROXEN 500 MG TABLET: 500 | 5 days supply | Qty: 10 | Fill #0

## 2019-07-17 NOTE — Patient Instructions (Signed)
Juan Boyle,   Thanks for seeing [place] today. For your knee pain, I will prescribe Naproxen 500 mg twice a day for 5 days.   Please rest as much as you can and apply ice to your right knee.   Acute Knee Pain, Adult Many things can cause knee pain. Sometimes, knee pain is sudden (acute) and may be caused by damage, swelling, or irritation of the muscles and tissues that support your knee. The pain often goes away on its own with time and rest. If the pain does not go away, tests may be done to find out what is causing the pain. Follow these instructions at home: ?  Activity  Rest your knee.  Do not do things that cause pain.  Avoid activities where both feet leave the ground at the same time (high-impact activities). Examples are running, jumping rope, and doing jumping jacks.  Work with a physical therapist to make a safe exercise program, as told by your doctor. Managing pain, stiffness, and swelling   If told, put ice on the knee: ? Put ice in a plastic bag. ? Place a towel between your skin and the bag. ? Leave the ice on for 20 minutes, 2-3 times a day.  If told, put pressure (compression) on your injured knee to control swelling, give support, and help with discomfort. Compression may be done with an elastic bandage. General instructions  Take all medicines only as told by your doctor.  Raise (elevate) your knee while you are sitting or lying down. Make sure your knee is higher than your heart.  Sleep with a pillow under your knee.  Do not use any products that contain nicotine or tobacco. These include cigarettes, e-cigarettes, and chewing tobacco. These products may slow down healing. If you need help quitting, ask your doctor.  If you are overweight, work with your doctor and a food expert (dietitian) to set goals to lose weight. Being overweight can make your knee hurt more.  Keep all follow-up visits as told by your doctor. This is important. Contact a doctor  if:  The knee pain does not stop.  The knee pain changes or gets worse.  You have a fever along with knee pain.  Your knee feels warm when you touch it.  Your knee gives out or locks up. Get help right away if:  Your knee swells, and the swelling gets worse.  You cannot move your knee.  You have very bad knee pain. Summary  Many things can cause knee pain. The pain often goes away on its own with time and rest.  Your doctor may do tests to find out the cause of the pain.  Pay attention to any changes in your symptoms. Relieve your pain with rest, medicines, light activity, and use of ice.  Get help right away if you cannot move your knee or your knee pain is very bad. This information is not intended to replace advice given to you by your health care provider. Make sure you discuss any questions you have with your health care provider. Document Revised: 11/08/2017 Document Reviewed: 11/08/2017 Elsevier Patient Education  [name].

## 2019-07-17 NOTE — Assessment & Plan Note (Signed)
Right knee pain: Juan Boyle reports that 2 days ago he tripped and fell and unfortunately landed on his right knee.  Since onset, he has tried icy hot which has improved his pain.  He rates his pain currently at 5 out of 10.  His right knee is mildly swollen compared to the left however there is full range of motion.  He complains of pain with ambulation but does not limit his ADLS.   On physical exam, he does have mild swelling of the right knee compared to the left but no evidence of patella fracture. ROM is intact though somewhat limited by pain.      Plan:  -Naproxen 500mg  BID x 5 days -Rest, Ice, Slow exercise. -RTC prn

## 2019-07-17 NOTE — Progress Notes (Signed)
   CC: Right knee pain  HPI:  [name] is a 64 y.o. was type 2 diabetes mellitus, hypertension who is here in the clinic for evaluation of right knee pain.  Please see problem based charting for further details.    Past Medical History:  Diagnosis Date  . Asthma   . Hypertension    Review of Systems:   Review of Systems  Constitutional: Negative for chills, fever and weight loss.  Cardiovascular: Negative for palpitations.  Musculoskeletal: Positive for falls (Mechanical ) and joint pain. Back pain: Right knee.  Neurological: Negative for dizziness.    Physical Exam:  Vitals:   07/17/19 0930 07/17/19 0931  BP:  (!) 142/73  Pulse:  85  Temp:  (!) 97.4 F (36.3 C)  TempSrc:  Oral  SpO2:  100%  Weight: 129 lb 8 oz (58.7 kg)   Height: 4\' 11"  (1.499 m)    Physical Exam  Constitutional: He is well-developed, well-nourished, and in no distress.  Musculoskeletal:        General: Tenderness (Right knee) present. No deformity or edema.     Comments: mild swelling of the right knee compared to the left but no evidence of patella fracture. ROM is intact though somewhat limited by pain.         Assessment & Plan:   See Encounters Tab for problem based charting.  Patient discussed with Dr. Philipp Ovens

## 2019-07-17 NOTE — Progress Notes (Signed)
Internal Medicine Clinic Attending  Case discussed with Dr. Eileen Stanford at the time of the visit.  We reviewed the resident's history and exam and pertinent patient test results.  I agree with the assessment, diagnosis, and plan of care documented in the resident's note.   I did not personally examine the knee, but based on my assessment of the picture documented in Dr. Nelia Shi note, patient appears to have a moderate sized effusion of his right knee. I assume traumatic effusion from his fall. This should resolve on it's own, but if not he can return for therapeutic / diagnostic arthrocentesis.

## 2019-07-22 ENCOUNTER — Ambulatory Visit (INDEPENDENT_AMBULATORY_CARE_PROVIDER_SITE_OTHER): Payer: 59 | Admitting: Internal Medicine

## 2019-07-22 ENCOUNTER — Encounter: Payer: Self-pay | Admitting: Dietician

## 2019-07-22 ENCOUNTER — Encounter: Payer: Self-pay | Admitting: Internal Medicine

## 2019-07-22 ENCOUNTER — Ambulatory Visit (INDEPENDENT_AMBULATORY_CARE_PROVIDER_SITE_OTHER): Payer: 59 | Admitting: Dietician

## 2019-07-22 VITALS — BP 156/86 | HR 73 | Temp 98.3°F | Ht 59.0 in | Wt 131.5 lb

## 2019-07-22 DIAGNOSIS — I10 Essential (primary) hypertension: Secondary | ICD-10-CM

## 2019-07-22 DIAGNOSIS — Z0000 Encounter for general adult medical examination without abnormal findings: Secondary | ICD-10-CM | POA: Diagnosis not present

## 2019-07-22 DIAGNOSIS — G8911 Acute pain due to trauma: Secondary | ICD-10-CM | POA: Diagnosis not present

## 2019-07-22 DIAGNOSIS — Z713 Dietary counseling and surveillance: Secondary | ICD-10-CM | POA: Diagnosis not present

## 2019-07-22 DIAGNOSIS — E1152 Type 2 diabetes mellitus with diabetic peripheral angiopathy with gangrene: Secondary | ICD-10-CM

## 2019-07-22 DIAGNOSIS — Z791 Long term (current) use of non-steroidal anti-inflammatories (NSAID): Secondary | ICD-10-CM | POA: Diagnosis not present

## 2019-07-22 DIAGNOSIS — J45909 Unspecified asthma, uncomplicated: Secondary | ICD-10-CM | POA: Diagnosis not present

## 2019-07-22 DIAGNOSIS — H5789 Other specified disorders of eye and adnexa: Secondary | ICD-10-CM | POA: Diagnosis not present

## 2019-07-22 DIAGNOSIS — Z7984 Long term (current) use of oral hypoglycemic drugs: Secondary | ICD-10-CM

## 2019-07-22 DIAGNOSIS — M25561 Pain in right knee: Secondary | ICD-10-CM | POA: Diagnosis not present

## 2019-07-22 DIAGNOSIS — H539 Unspecified visual disturbance: Secondary | ICD-10-CM

## 2019-07-22 DIAGNOSIS — Z79899 Other long term (current) drug therapy: Secondary | ICD-10-CM

## 2019-07-22 DIAGNOSIS — E119 Type 2 diabetes mellitus without complications: Secondary | ICD-10-CM

## 2019-07-22 LAB — POCT GLYCOSYLATED HEMOGLOBIN (HGB A1C): Hemoglobin A1C: 7.5 % — AB (ref 4.0–5.6)

## 2019-07-22 LAB — GLUCOSE, CAPILLARY
Glucose-Capillary: 114 mg/dL — ABNORMAL HIGH (ref 70–99)
Glucose-Capillary: 93 mg/dL (ref 70–99)

## 2019-07-22 MED ORDER — NAPROXEN 500 MG PO TABS: 500.0000 mg | ORAL_TABLET | Freq: Two times a day (BID) | ORAL | 0 refills | Status: AC

## 2019-07-22 MED FILL — NAPROXEN 500 MG TABLET: 500 | 7 days supply | Qty: 28 | Fill #0

## 2019-07-22 NOTE — Progress Notes (Signed)
75

## 2019-07-22 NOTE — Patient Instructions (Addendum)
Good job keeping your weight stable!  Keep drinking mostly water.  Your foot doctor appointment is:  Monday March 29 at 9 am with Dr. Adah Perl at [facility] on [address] goal is to go to the foot doctor appointments to care for your feet.   Please follow up with me in 3-4 months- May- June 2021.  Butch Penny [phone]

## 2019-07-22 NOTE — Progress Notes (Signed)
Has gotten flu vaccine- thinks September Metformin too strong; not taking only takes aspirin daily  Not interested in making any changes to die tor activity Agrees to see podiatry ongoing for foot care.  A1c and microablumin due today  Wt Readings from Last 10 Encounters:  07/22/19 131 lb 8 oz (59.6 kg)  07/17/19 129 lb 8 oz (58.7 kg)  07/07/19 131 lb 6.4 oz (59.6 kg)  06/02/19 127 lb 11.2 oz (57.9 kg)  06/02/19 127 lb 11.2 oz (57.9 kg)  05/12/19 134 lb (60.8 kg)  04/22/19 130 lb 9.6 oz (59.2 kg)  04/21/19 133 lb (60.3 kg)  12/26/16 140 lb (63.5 kg)  07/22/14 120 lb (54.4 kg)   Lab Results  Component Value Date   HGBA1C 7.8 (A) 04/21/2019    Diabetes Self-Management Education  Visit Type: Follow-up  Appt. Start Time: 1500 Appt. End Time: 3570  07/22/2019  [name], identified by name and date of birth, is a 64 y.o. male with a diagnosis of Diabetes:  .   ASSESSMENT  Weight 131 lb 8 oz (59.6 kg). Body mass index is 26.56 kg/m.  Diabetes Self-Management Education - 07/22/19 1500      Visit Information   Visit Type  Follow-up      Health Coping   How would you rate your overall health?  Good      Complications   How often do you check your blood sugar?  0 times/day (not testing)    Number of hypoglycemic episodes per month  0    Number of hyperglycemic episodes per week  0    Have you had a dilated eye exam in the past 12 months?  Yes    Have you had a dental exam in the past 12 months?  Yes    Are you checking your feet?  Yes    How many days per week are you checking your feet?  2      Dietary Intake   Breakfast  4 oz juice    Lunch  buys at [employer]- chicken, mac & cheese, small soda    Dinner  rice and meat    Snack (evening)  chips    Beverage(s)  juice, water, soda      Exercise   Exercise Type  ADL's;Moderate (swimming / aerobic walking)    How many days per week to you exercise?  5    How many minutes per day do you exercise?  60    Total  minutes per week of exercise  300      Patient Education   Previous Diabetes Education  Yes (please comment)   here   Physical activity and exercise   Role of exercise on diabetes management, blood pressure control and cardiac health.    Monitoring  Identified appropriate SMBG and/or A1C goals.    Chronic complications  Relationship between chronic complications and blood glucose control;Assessed and discussed foot care and prevention of foot problems;Reviewed with patient heart disease, higher risk of, and prevention      Individualized Goals (developed by patient)   Reducing Risk  do foot checks daily   go to foot doctor appointment     Outcomes   Expected Outcomes  Demonstrated limited interest in learning.  Expect minimal changes    Future DMSE  3-4 months    Program Status  Completed      Subsequent Visit   Since your last visit have you continued or begun to take  your medications as prescribed?  No    Since your last visit have you experienced any weight changes?  No change    Since your last visit, are you checking your blood glucose at least once a day?  N/A       Individualized Plan for Diabetes Self-Management Training:   Learning Objective:  Patient will have a greater understanding of diabetes self-management. Patient education plan is to attend individual and/or group sessions per assessed needs and concerns.   Plan:   Patient Instructions  Good job keeping your weight stable!  Keep drinking mostly water.  Your foot doctor appointment is:  Monday March 29 at 9 am with Dr. Adah Perl at [facility] on [address] goal is to go to the foot doctor appointments to care for your feet.   Please follow up with me in 3-4 months- May- June 2021.  Butch Penny [phone]    Expected Outcomes:  Demonstrated limited interest in learning.  Expect minimal changes  Education material provided: Diabetes Resources  If problems or questions, patient to  contact team via:  Phone  Future DSME appointment: 3-4 months  Debera Lat, RD 07/22/2019 4:17 PM.

## 2019-07-23 ENCOUNTER — Encounter: Payer: Self-pay | Admitting: Internal Medicine

## 2019-07-23 ENCOUNTER — Telehealth: Payer: Self-pay | Admitting: Internal Medicine

## 2019-07-23 DIAGNOSIS — Z0000 Encounter for general adult medical examination without abnormal findings: Secondary | ICD-10-CM | POA: Insufficient documentation

## 2019-07-23 LAB — HEPATITIS C ANTIBODY: Hep C Virus Ab: <0.1 s/co ratio (ref 0.0–0.9)

## 2019-07-23 LAB — HIV ANTIBODY (ROUTINE TESTING W REFLEX): HIV Screen 4th Generation wRfx: NONREACTIVE

## 2019-07-23 NOTE — Assessment & Plan Note (Signed)
Seen by Dr.Groat. 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 Has vision color changes falsely attributed to medication adverse reaction. Currently denies any eye pain, visual field deficit or excessive tearing.  - F/u with optho
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
## 2019-07-23 NOTE — Assessment & Plan Note (Signed)
Continues to complain of visual changes associated with medication side effects despite not taking his home medications. Chart review shows history of mild cataract and hypertensive retinopathy. Had prolonged discussion regarding these disease processes as likely causes of his vision changes but Juan Boyle disagrees. Currently denies any eye pain or peripheral vision loss. Encouraged Mr.Woodson to f/u with ophthalmology if his vision changes continues to persist.  - F/u with opthalmology

## 2019-07-23 NOTE — Assessment & Plan Note (Signed)
Agreeable to be screened for HIV and hepatitis C this visit

## 2019-07-23 NOTE — Assessment & Plan Note (Signed)
BP Readings from Last 3 Encounters:  07/22/19 (!) 156/86  07/17/19 (!) 142/73  07/15/19 (!) 201/89   Juan Boyle presents with elevated blood pressure. Unfortunately he has been non-adherent to his medication regimen due to concerns of side effects. He denies any lower extremity edema or light-headedness but is concerned about vision changes. Spoke in length with Juan Boyle regarding likelihood of his vision changes rising from his uncontrolled blood pressure and importance of taking his bp meds as prescribed. Juan Boyle was unable to perform teach-back to express his understanding but states he will 'try to take his meds'  - Restart amlodipine 5mg  daily - Monitor bp - Will up-titrate at next visit if continues to be elevated

## 2019-07-23 NOTE — Assessment & Plan Note (Signed)
Lab Results  Component Value Date   HGBA1C 7.5 (A) 07/22/2019   Discussed with Juan Boyle regarding importance of medication adherence. He states that he feels his medications is 'too strong' and mentions vision changes as his adverse reaction. Denies any diarrhea, abdominal pain or nausea which are more common adverse reactions associated with Metformin. Currently not taking his metformin. Thankfully his hgb a1c has not worsened. Had prolonged discussion again regarding importance of medication adherence. Did not bring glucometer to visit.  - C/w metformin 500mg  BID - Encouraged to check blood sugars at home

## 2019-07-23 NOTE — Telephone Encounter (Signed)
Spoke with Mr.Polhamus's brother, Janeal Holmes, regarding need for X-ray of Mr.Spivey's knee. Mr.David expressed understanding.

## 2019-07-24 ENCOUNTER — Other Ambulatory Visit: Payer: Self-pay

## 2019-07-24 ENCOUNTER — Ambulatory Visit (HOSPITAL_COMMUNITY)
Admission: RE | Admit: 2019-07-24 | Discharge: 2019-07-24 | Disposition: A | Discharge status: Home / Self Care | Payer: 59 | Source: Ambulatory Visit | Attending: Internal Medicine | Admitting: Internal Medicine

## 2019-07-24 DIAGNOSIS — M25561 Pain in right knee: Secondary | ICD-10-CM | POA: Insufficient documentation

## 2019-07-24 NOTE — Progress Notes (Signed)
Internal Medicine Clinic Attending ° °Case discussed with Dr. Lee at the time of the visit.  We reviewed the resident’s history and exam and pertinent patient test results.  I agree with the assessment, diagnosis, and plan of care documented in the resident’s note.  °

## 2019-07-28 ENCOUNTER — Other Ambulatory Visit: Payer: Self-pay | Admitting: Internal Medicine

## 2019-07-28 DIAGNOSIS — S82832A Other fracture of upper and lower end of left fibula, initial encounter for closed fracture: Secondary | ICD-10-CM
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
## 2019-07-29 DIAGNOSIS — M25562 Pain in left knee: Secondary | ICD-10-CM | POA: Diagnosis not present

## 2019-08-05 ENCOUNTER — Telehealth: Payer: Self-pay | Admitting: Internal Medicine
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
## 2019-08-12 ENCOUNTER — Encounter: Payer: 59 | Admitting: Internal Medicine

## 2019-08-13 ENCOUNTER — Encounter: Payer: Self-pay | Admitting: Internal Medicine

## 2019-08-13 ENCOUNTER — Ambulatory Visit (INDEPENDENT_AMBULATORY_CARE_PROVIDER_SITE_OTHER): Payer: 59 | Admitting: Internal Medicine

## 2019-08-13 DIAGNOSIS — Z79899 Other long term (current) drug therapy: Secondary | ICD-10-CM | POA: Diagnosis not present

## 2019-08-13 DIAGNOSIS — E1152 Type 2 diabetes mellitus with diabetic peripheral angiopathy with gangrene: Secondary | ICD-10-CM

## 2019-08-13 DIAGNOSIS — I10 Essential (primary) hypertension: Secondary | ICD-10-CM | POA: Diagnosis not present

## 2019-08-13 DIAGNOSIS — E119 Type 2 diabetes mellitus without complications: Secondary | ICD-10-CM

## 2019-08-13 DIAGNOSIS — Z7984 Long term (current) use of oral hypoglycemic drugs: Secondary | ICD-10-CM

## 2019-08-13 DIAGNOSIS — Z0000 Encounter for general adult medical examination without abnormal findings: Secondary | ICD-10-CM

## 2019-08-13 LAB — GLUCOSE, CAPILLARY: Glucose-Capillary: 164 mg/dL — ABNORMAL HIGH (ref 70–99)

## 2019-08-13 MED ORDER — AMLODIPINE BESYLATE 10 MG PO TABS: 10.0000 mg | ORAL_TABLET | Freq: Every day | ORAL | 3 refills | Status: DC

## 2019-08-13 MED FILL — AMLODIPINE BESYLATE 10 MG T: 10 | 90 days supply | Qty: 90 | Fill #0

## 2019-08-13 NOTE — Assessment & Plan Note (Signed)
Patient reports that he previously had colonoscopy performed approximately 1 to 2 years ago, unclear if patient understands what a colonoscopy is.  Patient's brother usually accompanies him to visits, it would be helpful to discuss need for colonoscopy when brother is present.  Patient reports having received a flu vaccine this year.

## 2019-08-13 NOTE — Progress Notes (Signed)
   CC: Followup on hypertension and T2DM  HPI: Patient is a 64 year old male with past medical history of hypertension, type 2 diabetes mellitus who presents for follow-up.  Juan Boyle is a 64 y.o.   Past Medical History:  Diagnosis Date  . Asthma   . Hypertension    Review of Systems:   Review of Systems  Constitutional: Negative for chills and fever.  HENT: Negative for congestion.   Respiratory: Negative for cough and shortness of breath.   Cardiovascular: Negative for chest pain.  Gastrointestinal: Negative for abdominal pain, constipation, diarrhea, nausea and vomiting.  All other systems reviewed and are negative.  Physical Exam:  Vitals:   08/13/19 1039 08/13/19 1056  BP: (!) 149/80 (!) 153/77  Pulse: 71 (!) 59  Temp: 98.4 F (36.9 C)   TempSrc: Oral   SpO2: 100%   Weight: 129 lb 1.6 oz (58.6 kg)    Physical Exam  Constitutional: He is well-developed, well-nourished, and in no distress.  HENT:  Head: Normocephalic and atraumatic.  Eyes: EOM are normal. Right eye exhibits no discharge. Left eye exhibits no discharge.  Neck: No tracheal deviation present.  Cardiovascular: Normal rate and regular rhythm. Exam reveals no gallop and no friction rub.  No murmur heard. Pulmonary/Chest: Effort normal and breath sounds normal. No respiratory distress. He has no wheezes. He has no rales.  Abdominal: Soft. He exhibits no distension. There is no abdominal tenderness. There is no rebound and no guarding.  Musculoskeletal:        General: No tenderness, deformity or edema. Normal range of motion.     Cervical back: Normal range of motion.  Neurological: He is alert. Coordination normal.  Skin: Skin is warm and dry. No rash noted. He is not diaphoretic. No erythema.  Psychiatric: Memory and judgment normal.     Assessment & Plan:   See Encounters Tab for problem based charting.  Patient discussed with Dr. Dareen Piano

## 2019-08-13 NOTE — Assessment & Plan Note (Signed)
BP Readings from Last 3 Encounters:  08/13/19 (!) 153/77  07/22/19 (!) 156/86  07/17/19 (!) 142/73   Patient reports taking amlodipine 5 mg daily, missed dose yesterday, denies other missed dosages.  Given elevation on initial measurement and recheck, will increase dosage to amlodipine 10 mg daily.  Elevation is asymptomatic.  Plan: *Start amlodipine 10 mg daily *Follow-up with PCP in 3 months for recheck

## 2019-08-13 NOTE — Patient Instructions (Addendum)
You were seen in the clinic for follow-up on your blood pressure.  Your blood pressure is still high. We have increased the dose of your amlodipine from 5 mg to 10 mg  Take 10 mg of amlodipine once daily. I have sent a prescription for this to your pharmacy. To use up your 5 mg amlodipine tablets, you can take 2 of these once a day instead of the 10 mg tablets.  Please measure your blood sugar once a day, and record the values.  Please bring these measurements to your next appointment.  Please continue to take all your other medications as prescribed. Please followup in clinic in 3 months for a recheck on your blood pressure and diabetes.

## 2019-08-13 NOTE — Progress Notes (Signed)
Internal Medicine Clinic Attending  Case discussed with Dr. MacLean at the time of the visit.  We reviewed the resident's history and exam and pertinent patient test results.  I agree with the assessment, diagnosis, and plan of care documented in the resident's note.    

## 2019-08-13 NOTE — Assessment & Plan Note (Signed)
Patient states that he does not measure his blood glucose at home.  Counseled patient on need for measuring glucose once a day, and recording values.  *Continue with Metformin 500 mg twice daily

## 2019-09-08 ENCOUNTER — Ambulatory Visit: Payer: 59 | Admitting: Podiatry

## 2019-11-05 ENCOUNTER — Telehealth: Payer: Self-pay | Admitting: Internal Medicine

## 2019-11-05 ENCOUNTER — Ambulatory Visit (INDEPENDENT_AMBULATORY_CARE_PROVIDER_SITE_OTHER): Payer: 59 | Admitting: Internal Medicine

## 2019-11-05 ENCOUNTER — Other Ambulatory Visit: Payer: Self-pay

## 2019-11-05 ENCOUNTER — Encounter: Payer: Self-pay | Admitting: Internal Medicine

## 2019-11-05 VITALS — BP 162/80 | HR 60 | Temp 98.1°F | Wt 129.7 lb

## 2019-11-05 DIAGNOSIS — E1169 Type 2 diabetes mellitus with other specified complication: Secondary | ICD-10-CM

## 2019-11-05 DIAGNOSIS — I10 Essential (primary) hypertension: Secondary | ICD-10-CM

## 2019-11-05 DIAGNOSIS — Z23 Encounter for immunization: Secondary | ICD-10-CM

## 2019-11-05 DIAGNOSIS — Z0000 Encounter for general adult medical examination without abnormal findings: Secondary | ICD-10-CM

## 2019-11-05 DIAGNOSIS — E118 Type 2 diabetes mellitus with unspecified complications: Secondary | ICD-10-CM | POA: Insufficient documentation

## 2019-11-05 DIAGNOSIS — L84 Corns and callosities: Secondary | ICD-10-CM | POA: Diagnosis not present

## 2019-11-05 DIAGNOSIS — E1152 Type 2 diabetes mellitus with diabetic peripheral angiopathy with gangrene: Secondary | ICD-10-CM | POA: Diagnosis not present

## 2019-11-05 LAB — GLUCOSE, CAPILLARY: Glucose-Capillary: 136 mg/dL — ABNORMAL HIGH (ref 70–99)

## 2019-11-05 LAB — POCT GLYCOSYLATED HEMOGLOBIN (HGB A1C): Hemoglobin A1C: 7.6 % — AB (ref 4.0–5.6)

## 2019-11-05 MED ORDER — LISINOPRIL 10 MG PO TABS: 10.0000 mg | ORAL_TABLET | Freq: Every day | ORAL | 1 refills | Status: DC

## 2019-11-05 MED FILL — LISINOPRIL 10 MG TABS: 10 | 90 days supply | Qty: 90 | Fill #0

## 2019-11-05 NOTE — Assessment & Plan Note (Signed)
Juan Boyle agrees to receive Pneumovar 23 this visit

## 2019-11-05 NOTE — Progress Notes (Signed)
CC: Foot pain  HPI: Mr.Corbyn DVID BEDI is a 64 y.o. with PMH listed below presenting with complaint of foot pain. Please see problem based assessment and plan for further details.  Past Medical History:  Diagnosis Date  . Asthma   . Hypertension     Review of Systems: Review of Systems  Constitutional: Negative for chills, fever and malaise/fatigue.  Eyes: Positive for blurred vision. Negative for double vision.  Respiratory: Negative for shortness of breath.   Cardiovascular: Negative for chest pain, palpitations and leg swelling.  Gastrointestinal: Negative for constipation, diarrhea, nausea and vomiting.  All other systems reviewed and are negative.    Physical Exam: Vitals:   11/05/19 0854 11/05/19 0855 11/05/19 0930  BP:  (!) 170/74 (!) 162/80  Pulse:  60   Temp:  98.1 F (36.7 C)   TempSrc:  Oral   SpO2:  99%   Weight: 129 lb 11.2 oz (58.8 kg)      Physical Exam  Constitutional: He appears well-developed and well-nourished. No distress.  HENT:  Mouth/Throat: Oropharynx is clear and moist.  Neck: No JVD present.  Cardiovascular: Normal rate, regular rhythm, normal heart sounds and intact distal pulses.  No murmur heard. Respiratory: Effort normal and breath sounds normal. He has no wheezes. He has no rales.  GI: Soft. Bowel sounds are normal. He exhibits no distension. There is no abdominal tenderness.  Musculoskeletal:        General: Tenderness (Plantar foot tenderness to palpation) present. Normal range of motion.     Cervical back: Normal range of motion and neck supple.  Skin: Skin is warm and dry. There is erythema (Multiple large areas of callouses on plantar surface of both feet).    Assessment & Plan:   Hypertension BP Readings from Last 3 Encounters:  11/05/19 (!) 162/80  08/13/19 (!) 153/77  07/22/19 (!) 156/86   Mr.Oleary is a 64 yo M w/ PMH of HTN, T2DM and hearing loss presenting to [facility] for management of his chronic conditions. He mentions  feeling light-headedness with vision changes after starting amlodipine and has stopped taking all of his medications including his metformin. He denies any lower extremity edema. He mentions not wanting to take any medications at all. After further details, discussed importance of blood pressure control in preventing risk of stroke and heart attacks.  A/P Uncontrolled hypertension complicated by poor health literacy and medication non-adherence. Will d/c amlodipine due to presumed side effects and trail alternative therapy. Would benefit from Ace-I for renal-protection as well.  - Start lisinopril 10mg  daily - BMP - F/u for bp check in 4 weeks  T2DM (type 2 diabetes mellitus) ([place]) Lab Results  Component Value Date   HGBA1C 7.6 (A) 11/05/2019   Presents for management of his diabetes. Denies any significant polyuria, polydipsia, polyphagia. Does not check blood glucose at home. Also stopped taking his metformin due to side effects from anti-hypertensives and difficulty with managing meds at home. Denies any GI side effects.  - Restart metformin 500mg  BID w/ up-titration to 1000mg  BID - Discussed with Brother regarding assistance in enforcing medication adherence - Proteinuria screening  Diabetic foot ([place]) Presents w/ complaint of foot pain. Notable hard calluses on bilateral feet on exam without evidence of open ulcers. Was previously seen with Podiatry 6 months prior.   - Advised to use foot inserts - F/u with podiatry  Healthcare maintenance Mr.Chamberland agrees to receive Pneumovar 23 this visit    Patient discussed with Dr. Lynnae January   -  Gilberto Better, PGY2 [facility] Pager: [phone]

## 2019-11-05 NOTE — Telephone Encounter (Signed)
Spoke with Mr.Givler's brother, Janeal Holmes, with Mr.Flewellen's permission regarding need for medication adherence and importance of Mr.Rasch following up with podiatry for diabetic foot care. Mr.David expressed understanding.

## 2019-11-05 NOTE — Assessment & Plan Note (Signed)
BP Readings from Last 3 Encounters:  11/05/19 (!) 162/80  08/13/19 (!) 153/77  07/22/19 (!) 156/86   [name] is a 64 yo M w/ PMH of HTN, T2DM and hearing loss presenting to [facility] for management of his chronic conditions. He mentions feeling light-headedness with vision changes after starting amlodipine and has stopped taking all of his medications including his metformin. He denies any lower extremity edema. He mentions not wanting to take any medications at all. After further details, discussed importance of blood pressure control in preventing risk of stroke and heart attacks.  A/P Uncontrolled hypertension complicated by poor health literacy and medication non-adherence. Will d/c amlodipine due to presumed side effects and trail alternative therapy. Would benefit from Ace-I for renal-protection as well.  - Start lisinopril 10mg  daily - BMP - F/u for bp check in 4 weeks

## 2019-11-05 NOTE — Assessment & Plan Note (Signed)
Presents w/ complaint of foot pain. Notable hard calluses on bilateral feet on exam without evidence of open ulcers. Was previously seen with Podiatry 6 months prior.   - Advised to use foot inserts - F/u with podiatry

## 2019-11-05 NOTE — Patient Instructions (Addendum)
Thank you for allowing [place] to provide your care today. Today we discussed your blood pressure    I have ordered bmp and urine microalbumin labs for you. I will call if any are abnormal.    Today we made the following changes to your medications.    Please stop amlodipine 10mg  daily Please start lisinopril 10mg  daily Please re-start your metformin at 500mg  twice daily  Please follow-up in 4-6 weeks.    Should you have any questions or concerns please call the internal medicine clinic at [phone].     Diabetes Mellitus and Foot Care Foot care is an important part of your health, especially when you have diabetes. Diabetes may cause you to have problems because of poor blood flow (circulation) to your feet and legs, which can cause your skin to:  Become thinner and drier.  Break more easily.  Heal more slowly.  Peel and crack. You may also have nerve damage (neuropathy) in your legs and feet, causing decreased feeling in them. This means that you may not notice minor injuries to your feet that could lead to more serious problems. Noticing and addressing any potential problems early is the best way to prevent future foot problems. How to care for your feet Foot hygiene  Wash your feet daily with warm water and mild soap. Do not use hot water. Then, pat your feet and the areas between your toes until they are completely dry. Do not soak your feet as this can dry your skin.  Trim your toenails straight across. Do not dig under them or around the cuticle. File the edges of your nails with an emery board or nail file.  Apply a moisturizing lotion or petroleum jelly to the skin on your feet and to dry, brittle toenails. Use lotion that does not contain alcohol and is unscented. Do not apply lotion between your toes. Shoes and socks  Wear clean socks or stockings every day. Make sure they are not too tight. Do not wear knee-high stockings since they may decrease blood flow to your  legs.  Wear shoes that fit properly and have enough cushioning. Always look in your shoes before you put them on to be sure there are no objects inside.  To break in new shoes, wear them for just a few hours a day. This prevents injuries on your feet. Wounds, scrapes, corns, and calluses  Check your feet daily for blisters, cuts, bruises, sores, and redness. If you cannot see the bottom of your feet, use a mirror or ask someone for help.  Do not cut corns or calluses or try to remove them with medicine.  If you find a minor scrape, cut, or break in the skin on your feet, keep it and the skin around it clean and dry. You may clean these areas with mild soap and water. Do not clean the area with peroxide, alcohol, or iodine.  If you have a wound, scrape, corn, or callus on your foot, look at it several times a day to make sure it is healing and not infected. Check for: ? Redness, swelling, or pain. ? Fluid or blood. ? Warmth. ? Pus or a bad smell. General instructions  Do not cross your legs. This may decrease blood flow to your feet.  Do not use heating pads or hot water bottles on your feet. They may burn your skin. If you have lost feeling in your feet or legs, you may not know this is happening until it  is too late.  Protect your feet from hot and cold by wearing shoes, such as at the beach or on hot pavement.  Schedule a complete foot exam at least once a year (annually) or more often if you have foot problems. If you have foot problems, report any cuts, sores, or bruises to your health care provider immediately. Contact a health care provider if:  You have a medical condition that increases your risk of infection and you have any cuts, sores, or bruises on your feet.  You have an injury that is not healing.  You have redness on your legs or feet.  You feel burning or tingling in your legs or feet.  You have pain or cramps in your legs and feet.  Your legs or feet are  numb.  Your feet always feel cold.  You have pain around a toenail. Get help right away if:  You have a wound, scrape, corn, or callus on your foot and: ? You have pain, swelling, or redness that gets worse. ? You have fluid or blood coming from the wound, scrape, corn, or callus. ? Your wound, scrape, corn, or callus feels warm to the touch. ? You have pus or a bad smell coming from the wound, scrape, corn, or callus. ? You have a fever. ? You have a red line going up your leg. Summary  Check your feet every day for cuts, sores, red spots, swelling, and blisters.  Moisturize feet and legs daily.  Wear shoes that fit properly and have enough cushioning.  If you have foot problems, report any cuts, sores, or bruises to your health care provider immediately.  Schedule a complete foot exam at least once a year (annually) or more often if you have foot problems. This information is not intended to replace advice given to you by your health care provider. Make sure you discuss any questions you have with your health care provider. Document Revised: 02/19/2019 Document Reviewed: 06/30/2016 Elsevier Patient Education  [place].

## 2019-11-05 NOTE — Assessment & Plan Note (Addendum)
Lab Results  Component Value Date   HGBA1C 7.6 (A) 11/05/2019   Presents for management of his diabetes. Denies any significant polyuria, polydipsia, polyphagia. Does not check blood glucose at home. Also stopped taking his metformin due to side effects from anti-hypertensives and difficulty with managing meds at home. Denies any GI side effects.  - Restart metformin 500mg  BID w/ up-titration to 1000mg  BID - Discussed with Brother regarding assistance in enforcing medication adherence - Proteinuria screening

## 2019-11-06 LAB — BMP8+ANION GAP
Anion Gap: 14 mmol/L (ref 10.0–18.0)
BUN/Creatinine Ratio: 14 (ref 10–24)
BUN: 13 mg/dL (ref 8–27)
CO2: 22 mmol/L (ref 20–29)
Calcium: 8.7 mg/dL (ref 8.6–10.2)
Chloride: 104 mmol/L (ref 96–106)
Creatinine, Ser: 0.95 mg/dL (ref 0.76–1.27)
GFR calc Af Amer: 99 mL/min/{1.73_m2} (ref >59)
GFR calc non Af Amer: 85 mL/min/{1.73_m2} (ref >59)
Glucose: 133 mg/dL — ABNORMAL HIGH (ref 65–99)
Potassium: 4 mmol/L (ref 3.5–5.2)
Sodium: 140 mmol/L (ref 134–144)

## 2019-11-06 LAB — MICROALBUMIN / CREATININE URINE RATIO
Creatinine, Urine: 157.1 mg/dL
Microalb/Creat Ratio: <2 mg/g creat (ref 0–29)
Microalbumin, Urine: <3 ug/mL

## 2019-11-11 NOTE — Progress Notes (Signed)
Internal Medicine Clinic Attending ° °Case discussed with Dr. Lee at the time of the visit.  We reviewed the resident’s history and exam and pertinent patient test results.  I agree with the assessment, diagnosis, and plan of care documented in the resident’s note.  °

## 2019-12-22 ENCOUNTER — Encounter: Payer: Self-pay | Admitting: Internal Medicine

## 2019-12-22 ENCOUNTER — Other Ambulatory Visit: Payer: Self-pay

## 2019-12-22 ENCOUNTER — Ambulatory Visit (INDEPENDENT_AMBULATORY_CARE_PROVIDER_SITE_OTHER): Payer: 59 | Admitting: Internal Medicine

## 2019-12-22 VITALS — BP 155/82 | HR 72 | Temp 98.3°F | Ht 59.0 in | Wt 123.2 lb

## 2019-12-22 DIAGNOSIS — R3912 Poor urinary stream: Secondary | ICD-10-CM | POA: Diagnosis not present

## 2019-12-22 DIAGNOSIS — E1169 Type 2 diabetes mellitus with other specified complication: Secondary | ICD-10-CM

## 2019-12-22 DIAGNOSIS — E118 Type 2 diabetes mellitus with unspecified complications: Secondary | ICD-10-CM

## 2019-12-22 DIAGNOSIS — E1152 Type 2 diabetes mellitus with diabetic peripheral angiopathy with gangrene: Secondary | ICD-10-CM

## 2019-12-22 DIAGNOSIS — E119 Type 2 diabetes mellitus without complications: Secondary | ICD-10-CM | POA: Diagnosis not present

## 2019-12-22 DIAGNOSIS — L608 Other nail disorders: Secondary | ICD-10-CM | POA: Diagnosis not present

## 2019-12-22 DIAGNOSIS — L84 Corns and callosities: Secondary | ICD-10-CM

## 2019-12-22 DIAGNOSIS — N401 Enlarged prostate with lower urinary tract symptoms: Secondary | ICD-10-CM | POA: Insufficient documentation

## 2019-12-22 DIAGNOSIS — N528 Other male erectile dysfunction: Secondary | ICD-10-CM

## 2019-12-22 MED ORDER — TADALAFIL 5 MG PO TABS: 5.0000 mg | ORAL_TABLET | Freq: Every day | ORAL | 0 refills | Status: DC

## 2019-12-22 MED ORDER — METFORMIN HCL ER 500 MG PO TB24: 1000.0000 mg | ORAL_TABLET | Freq: Every day | ORAL | 3 refills | Status: DC

## 2019-12-22 MED FILL — METFORMIN HCL ER 500 MG TB2: 500 | 30 days supply | Qty: 60 | Fill #0

## 2019-12-22 MED FILL — TADALAFIL 5 MG TABS: 5 | 30 days supply | Qty: 30 | Fill #0

## 2019-12-22 NOTE — Assessment & Plan Note (Signed)
Continues to have multiple callouses with deformed nails. No open wounds or ulcers noted. Pinprick sensation intact. Advised to f/u with podiatry

## 2019-12-22 NOTE — Progress Notes (Signed)
Internal Medicine Clinic Attending ° °Case discussed with Dr. Lee at the time of the visit.  We reviewed the resident’s history and exam and pertinent patient test results.  I agree with the assessment, diagnosis, and plan of care documented in the resident’s note.  °

## 2019-12-22 NOTE — Assessment & Plan Note (Signed)
Presents w/ complaint of erectile dysfunction. Not an acute issue. Mentions having difficulty maintaining an erection as well as ejaculation. Denies any difficulty with initiating erection. Denies loss of sexual desire. Confirms that he continues to have morning erections.   A/P Appears to be relational erectile dysfunction. Does exam also suggests BPH. Advised lifestyle modifications in addition to BPH treatment. - Advised to reduced alcohol use - Encouraged to continue T2DM treatment

## 2019-12-22 NOTE — Progress Notes (Signed)
CC: 'Weak urine stream'  HPI: [name] is a 64 y.o. with PMH listed below presenting with complaint of weak urinary stream. Please see problem based assessment and plan for further details.  Past Medical History:  Diagnosis Date  . Asthma   . Hypertension     Review of Systems: Review of Systems  Constitutional: Negative for chills, fever and malaise/fatigue.  Eyes: Negative for blurred vision.  Respiratory: Negative for shortness of breath.   Cardiovascular: Negative for chest pain.  Gastrointestinal: Negative for constipation, diarrhea, nausea and vomiting.  All other systems reviewed and are negative.    Physical Exam: Vitals:   12/22/19 0918  BP: (!) 155/82  Pulse: 72  Temp: 98.3 F (36.8 C)  TempSrc: Oral  SpO2: 100%  Weight: 123 lb 3.2 oz (55.9 kg)  Height: 4\' 11"  (1.499 m)   Physical Exam Constitutional:      General: He is not in acute distress.    Appearance: He is normal weight.  Cardiovascular:     Rate and Rhythm: Normal rate and regular rhythm.     Heart sounds: Normal heart sounds. No murmur heard.   Pulmonary:     Effort: Pulmonary effort is normal.     Breath sounds: Normal breath sounds. No wheezing or rales.  Genitourinary:    Penis: Normal.      Testes: Normal.     Rectum: Normal.     Comments: Diffuse firm enlargement of prostate without tenderness or asymmetry Skin:    General: Skin is warm and dry.     Findings: Lesion (Multiple hyperpigmented callouses on plantar surface of bilateral feet without ulceration or open wounds) present.  Neurological:     Mental Status: He is alert.     Assessment & Plan:   Benign prostatic hyperplasia with weak urinary stream Juan Boyle is a 64 yo M w/ PMH of alcohol abuse, tobacco use, htn and T2Dm presenting to [facility] with complaint of erectile dysfunction and weak urinary stream. He mentions this is chronic but just have not had a chance to discuss with physician. He mentions that he has  noticed worsening urinary stream with significant post-micturition dribble. He denies any dysuria, urgency or frequency. He is unsure when his symptoms started but state it has been 'going on for a while.'  A/P On rectal exam, noted to have enlarged prostate. No tenderness or boggy consistency. Appears to be BPH. Could start with PDE-5 inhibitor vs Alpha antagonist. With concurrent erectile dysfunction, will start with PDE-5i. Getting baseline PSA as PDE-5i can alter levels. - tadalafil 5mg  daily - PSA  Other male erectile dysfunction Presents w/ complaint of erectile dysfunction. Not an acute issue. Mentions having difficulty maintaining an erection as well as ejaculation. Denies any difficulty with initiating erection. Denies loss of sexual desire. Confirms that he continues to have morning erections.   A/P Appears to be relational erectile dysfunction. Does exam also suggests BPH. Advised lifestyle modifications in addition to BPH treatment. - Advised to reduced alcohol use - Encouraged to continue T2DM treatment  T2DM (type 2 diabetes mellitus) ([place]) Presents for continued management of his diabetes. Denies any significant polyuria, polydipsia or polyphagia. Continues to not check blood glucose at home. Currently on metformin 500mg  daily. Mentions he did not know he was suppose to titrate his meds up. Denies any significant GI side effects.  - Change to metformin XR 1000mg  daily to promote adherence - F/u with podiatry (multiple callouses on exam) - F/u in 1  month for hgb a1c check    Patient discussed with Dr. Jimmye Norman  -Juan Boyle, [facility] Pager: [phone]

## 2019-12-22 NOTE — Assessment & Plan Note (Signed)
Presents for continued management of his diabetes. Denies any significant polyuria, polydipsia or polyphagia. Continues to not check blood glucose at home. Currently on metformin 500mg  daily. Mentions he did not know he was suppose to titrate his meds up. Denies any significant GI side effects.  - Change to metformin XR 1000mg  daily to promote adherence - F/u with podiatry (multiple callouses on exam) - F/u in 1 month for hgb a1c check

## 2019-12-22 NOTE — Assessment & Plan Note (Signed)
Juan Boyle is a 64 yo M w/ PMH of alcohol abuse, tobacco use, htn and T2Dm presenting to [facility] with complaint of erectile dysfunction and weak urinary stream. He mentions this is chronic but just have not had a chance to discuss with physician. He mentions that he has noticed worsening urinary stream with significant post-micturition dribble. He denies any dysuria, urgency or frequency. He is unsure when his symptoms started but state it has been 'going on for a while.'  A/P On rectal exam, noted to have enlarged prostate. No tenderness or boggy consistency. Appears to be BPH. Could start with PDE-5 inhibitor vs Alpha antagonist. With concurrent erectile dysfunction, will start with PDE-5i. Getting baseline PSA as PDE-5i can alter levels. - tadalafil 5mg  daily - PSA

## 2019-12-22 NOTE — Patient Instructions (Addendum)
Thank you for allowing [place] to provide your care today. Today we discussed your erection    I have ordered psa labs for you. I will call if any are abnormal.    Today we made the following changes to your medications.    Increase your metformin to 1000mg  BID Please start tadalafil 5mg  daily  Please follow-up in 4 weeks.    Should you have any questions or concerns please call the internal medicine clinic at [phone].     Benign Prostatic Hyperplasia  Benign prostatic hyperplasia (BPH) is an enlarged prostate gland that is caused by the normal aging process and not by cancer. The prostate is a walnut-sized gland that is involved in the production of semen. It is located in front of the rectum and below the bladder. The bladder stores urine and the urethra is the tube that carries the urine out of the body. The prostate may get bigger as a man gets older. An enlarged prostate can press on the urethra. This can make it harder to pass urine. The build-up of urine in the bladder can cause infection. Back pressure and infection may progress to bladder damage and kidney (renal) failure. What are the causes? This condition is part of a normal aging process. However, not all men develop problems from this condition. If the prostate enlarges away from the urethra, urine flow will not be blocked. If it enlarges toward the urethra and compresses it, there will be problems passing urine. What increases the risk? This condition is more likely to develop in men over the age of 71 years. What are the signs or symptoms? Symptoms of this condition include:  Getting up often during the night to urinate.  Needing to urinate frequently during the day.  Difficulty starting urine flow.  Decrease in size and strength of your urine stream.  Leaking (dribbling) after urinating.  Inability to pass urine. This needs immediate treatment.  Inability to completely empty your bladder.  Pain when you pass  urine. This is more common if there is also an infection.  Urinary tract infection (UTI). How is this diagnosed? This condition is diagnosed based on your medical history, a physical exam, and your symptoms. Tests will also be done, such as:  A post-void bladder scan. This measures any amount of urine that may remain in your bladder after you finish urinating.  A digital rectal exam. In a rectal exam, your health care provider checks your prostate by putting a lubricated, gloved finger into your rectum to feel the back of your prostate gland. This exam detects the size of your gland and any abnormal lumps or growths.  An exam of your urine (urinalysis).  A prostate specific antigen (PSA) screening. This is a blood test used to screen for prostate cancer.  An ultrasound. This test uses sound waves to electronically produce a picture of your prostate gland. Your health care provider may refer you to a specialist in kidney and prostate diseases (urologist). How is this treated? Once symptoms begin, your health care provider will monitor your condition (active surveillance or watchful waiting). Treatment for this condition will depend on the severity of your condition. Treatment may include:  Observation and yearly exams. This may be the only treatment needed if your condition and symptoms are mild.  Medicines to relieve your symptoms, including: ? Medicines to shrink the prostate. ? Medicines to relax the muscle of the prostate.  Surgery in severe cases. Surgery may include: ? Prostatectomy. In this  procedure, the prostate tissue is removed completely through an open incision or with a laparoscope or robotics. ? Transurethral resection of the prostate (TURP). In this procedure, a tool is inserted through the opening at the tip of the penis (urethra). It is used to cut away tissue of the inner core of the prostate. The pieces are removed through the same opening of the penis. This removes the  blockage. ? Transurethral incision (TUIP). In this procedure, small cuts are made in the prostate. This lessens the prostate's pressure on the urethra. ? Transurethral microwave thermotherapy (TUMT). This procedure uses microwaves to create heat. The heat destroys and removes a small amount of prostate tissue. ? Transurethral needle ablation (TUNA). This procedure uses radio frequencies to destroy and remove a small amount of prostate tissue. ? Interstitial laser coagulation ([place]). This procedure uses a laser to destroy and remove a small amount of prostate tissue. ? Transurethral electrovaporization (TUVP). This procedure uses electrodes to destroy and remove a small amount of prostate tissue. ? Prostatic urethral lift. This procedure inserts an implant to push the lobes of the prostate away from the urethra. Follow these instructions at home:  Take over-the-counter and prescription medicines only as told by your health care provider.  Monitor your symptoms for any changes. Contact your health care provider with any changes.  Avoid drinking large amounts of liquid before going to bed or out in public.  Avoid or reduce how much caffeine or alcohol you drink.  Give yourself time when you urinate.  Keep all follow-up visits as told by your health care provider. This is important. Contact a health care provider if:  You have unexplained back pain.  Your symptoms do not get better with treatment.  You develop side effects from the medicine you are taking.  Your urine becomes very dark or has a bad smell.  Your lower abdomen becomes distended and you have trouble passing your urine. Get help right away if:  You have a fever or chills.  You suddenly cannot urinate.  You feel lightheaded, or very dizzy, or you faint.  There are large amounts of blood or clots in the urine.  Your urinary problems become hard to manage.  You develop moderate to severe low back or flank pain. The  flank is the side of your body between the ribs and the hip. These symptoms may represent a serious problem that is an emergency. Do not wait to see if the symptoms will go away. Get medical help right away. Call your local emergency services (911 in the [place] Do not drive yourself to the hospital. Summary  Benign prostatic hyperplasia (BPH) is an enlarged prostate that is caused by the normal aging process and not by cancer.  An enlarged prostate can press on the urethra. This can make it hard to pass urine.  This condition is part of a normal aging process and is more likely to develop in men over the age of 70 years.  Get help right away if you suddenly cannot urinate. This information is not intended to replace advice given to you by your health care provider. Make sure you discuss any questions you have with your health care provider. Document Revised: 04/23/2018 Document Reviewed: 07/03/2016 Elsevier Patient Education  2020 [employer].

## 2019-12-23 LAB — PSA: Prostate Specific Ag, Serum: 1.2 ng/mL (ref 0.0–4.0)

## 2020-03-15 ENCOUNTER — Encounter: Payer: Self-pay | Admitting: Podiatry

## 2020-03-15 ENCOUNTER — Other Ambulatory Visit: Payer: Self-pay

## 2020-03-15 ENCOUNTER — Ambulatory Visit (INDEPENDENT_AMBULATORY_CARE_PROVIDER_SITE_OTHER): Payer: 59 | Admitting: Podiatry

## 2020-03-15 DIAGNOSIS — M79674 Pain in right toe(s): Secondary | ICD-10-CM | POA: Diagnosis not present

## 2020-03-15 DIAGNOSIS — E1149 Type 2 diabetes mellitus with other diabetic neurological complication: Secondary | ICD-10-CM

## 2020-03-15 DIAGNOSIS — M79675 Pain in left toe(s): Secondary | ICD-10-CM

## 2020-03-15 DIAGNOSIS — B351 Tinea unguium: Secondary | ICD-10-CM

## 2020-03-15 DIAGNOSIS — Q828 Other specified congenital malformations of skin: Secondary | ICD-10-CM | POA: Diagnosis not present

## 2020-03-15 DIAGNOSIS — L84 Corns and callosities: Secondary | ICD-10-CM

## 2020-03-15 DIAGNOSIS — E1140 Type 2 diabetes mellitus with diabetic neuropathy, unspecified: Secondary | ICD-10-CM | POA: Diagnosis not present

## 2020-03-15 NOTE — Patient Instructions (Signed)
Recommend [employer] with memory foam insoles. They can be purchased at [employer], [employer] or [employer]. Also on [email or web address].   Our office will call you in regards as to whether diabetic shoes/insoles will be covered by your insurance.

## 2020-03-18 NOTE — Progress Notes (Signed)
Subjective:  Patient ID: Juan Boyle, male    DOB: 1955-10-11,  MRN: [id number]  64 y.o. male presents with preventative diabetic foot care and painful porokeratotic lesion(s) b/l and painful mycotic toenails that limit ambulation. Painful toenails interfere with ambulation. Aggravating factors include wearing enclosed shoe gear. Pain is relieved with periodic professional debridement. Painful porokeratotic lesions are aggravated when weightbearing with and without shoegear. Pain is relieved with periodic professional debridement.  Patient did not check blood sugar this morning.   He relates pain when weightbearing when wearing work boots. 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  PCP: Mosetta Anis, MD and last visit was: 11/05/2019.  Review of Systems: Negative except as noted in the HPI.  Past Medical History:  Diagnosis Date  . Asthma   . Hypertension    History reviewed. No pertinent surgical history. Patient Active Problem List   Diagnosis Date Noted  . Benign prostatic hyperplasia with weak urinary stream 12/22/2019  . Other male erectile dysfunction 12/22/2019  . Diabetic foot ([place]) 11/05/2019  . Healthcare maintenance 07/23/2019  . Unilateral hearing loss, right 07/07/2019  . Vision changes 06/02/2019  . T2DM (type 2 diabetes mellitus) ([place]) 04/22/2019  . Mass of left eye 04/22/2019  . Alcohol use disorder, mild, abuse 04/22/2019  . Hypertension 01/03/2017  . Smokes 10/29/2015    Current Outpatient Medications:  .  aspirin EC 81 MG tablet, Take 1 tablet (81 mg total) by mouth daily., Disp: 150 tablet, Rfl: 2 .  blood glucose meter kit and supplies KIT, Dispense based on patient and insurance preference. Use up to four times daily as directed. (FOR ICD-9 250.00, 250.01)., Disp: 1 each, Rfl: 0 .  Blood Glucose Monitoring Suppl (FREESTYLE LITE) DEVI, Check blood sugar up to 1 time a day, Disp: 1 each, Rfl: 0 .  glucose blood (FREESTYLE LITE) test  strip, Check blood sugar up to 1 time a day, Disp: 100 each, Rfl: 12 .  Lancets 30G MISC, Check blood sugar up to 1 time a day, Disp: 100 each, Rfl: 5 .  lisinopril (ZESTRIL) 10 MG tablet, Take 1 tablet (10 mg total) by mouth daily., Disp: 90 tablet, Rfl: 1 .  metFORMIN (GLUCOPHAGE-XR) 500 MG 24 hr tablet, Take 2 tablets (1,000 mg total) by mouth daily with breakfast., Disp: 90 tablet, Rfl: 3 .  tadalafil (CIALIS) 5 MG tablet, Take 1 tablet (5 mg total) by mouth daily., Disp: 30 tablet, Rfl: 0 Allergies  Allergen Reactions  . Penicillins Anaphylaxis    Has patient had a PCN reaction causing immediate rash, facial/tongue/throat swelling, SOB or lightheadedness with hypotension: Yes Has patient had a PCN reaction causing severe rash involving mucus membranes or skin necrosis: Yes Has patient had a PCN reaction that required hospitalization: Yes Has patient had a PCN reaction occurring within the last 10 years: No If all of the above answers are "NO", then may proceed with Cephalosporin use.   . Amlodipine Other (See Comments)    Light-headedness   Social History   Tobacco Use  Smoking Status Current Every Day Smoker  . Packs/day: 0.50  . Years: 40.00  . Pack years: 20.00  Smokeless Tobacco Never Used  Tobacco Comment   Smoking 4-5 cigs per day; ready to quit   Objective:  There were no vitals filed for this visit. Constitutional Patient is a pleasant 64 y.o. African American male in NAD.Marland Kitchen AAO x 3.  Vascular Capillary fill time to digits <3 seconds  b/l lower extremities. Palpable pedal pulses b/l LE. Pedal hair present. Lower extremity skin temperature gradient within normal limits. No cyanosis or clubbing noted.  Neurologic Normal speech. Oriented to person, place, and time. Protective sensation decreased with 10 gram monofilament b/l. Vibratory sensation decreased b/l.  Dermatologic Pedal skin with normal turgor, texture and tone bilaterally. No open wounds bilaterally. No  interdigital macerations bilaterally. Toenails 1-5 b/l elongated, discolored, dystrophic, thickened, crumbly with subungual debris and tenderness to dorsal palpation. Hyperkeratotic lesion(s) L hallux, R hallux, submet head 2 right foot and plantar aspect of heel .  No erythema, no edema, no drainage, no flocculence. Porokeratotic lesion(s) submet head 3 left foot. No erythema, no edema, no drainage, no flocculence.  Orthopedic: Normal muscle strength 5/5 to all lower extremity muscle groups bilaterally. Hammertoes noted to the L 2nd toe and R 2nd toe. Pes planus deformity noted b/l.    Hemoglobin A1C Latest Ref Rng & Units 11/05/2019 07/22/2019 04/21/2019  HGBA1C 4.0 - 5.6 % 7.6(A) 7.5(A) 7.8(A)  Some recent data might be hidden   Assessment:   No diagnosis found. Plan:  Patient was evaluated and treated and all questions answered.  Onychomycosis with pain -Nails palliatively debridement as below. -Educated on self-care  Procedure: Nail Debridement Rationale: Pain Type of Debridement: manual, sharp debridement. Instrumentation: Nail nipper, rotary burr. Number of Nails: 10  -Examined patient. -Continue diabetic foot care principles. -Toenails 1-5 b/l were debrided in length and girth with sterile nail nippers and dremel without iatrogenic bleeding.  -Callus(es) L hallux, R hallux, submet head 2 right foot and plantar aspect of heel  pared utilizing sterile scalpel blade without complication or incident. Total number debrided =4. -Painful porokeratotic lesion(s) submet head 3 left foot pared and enucleated with sterile scalpel blade without incident. -Patient to report any pedal injuries to medical professional immediately. -Patient to continue soft, supportive shoe gear daily. Recommended [employer]. Will have benefits checked for diabetic shoes. -Patient/POA to call should there be question/concern in the interim.  Return in about 3 months (around 06/15/2020).  Marzetta Board, DPM

## 2020-04-05 ENCOUNTER — Encounter: Payer: 59 | Admitting: Internal Medicine

## 2020-05-05 ENCOUNTER — Telehealth: Payer: Self-pay | Admitting: Dietician

## 2020-05-05 ENCOUNTER — Encounter: Payer: Self-pay | Admitting: Dietician

## 2020-05-05 NOTE — Telephone Encounter (Signed)
Patient was called with this information and letter also mailed to him with this infomation. He verbalized understanding.

## 2020-05-05 NOTE — Telephone Encounter (Signed)
Per [facility] staff, patient is asking for help in getting his Covid booster shot. Per [facility] is giving Booster vaccines. According to [facility], he can get a booster cvaccine any Wednesday form 9 AM to 1 PM No appointment needed.

## 2020-06-21 ENCOUNTER — Ambulatory Visit: Payer: 59 | Admitting: Podiatry

## 2020-06-22 ENCOUNTER — Ambulatory Visit: Payer: 59 | Admitting: Podiatry

## 2020-07-12 ENCOUNTER — Ambulatory Visit (INDEPENDENT_AMBULATORY_CARE_PROVIDER_SITE_OTHER): Payer: 59 | Admitting: Podiatry

## 2020-07-12 ENCOUNTER — Encounter: Payer: Self-pay | Admitting: Podiatry

## 2020-07-12 ENCOUNTER — Other Ambulatory Visit: Payer: Self-pay

## 2020-07-12 DIAGNOSIS — E1149 Type 2 diabetes mellitus with other diabetic neurological complication: Secondary | ICD-10-CM | POA: Diagnosis not present

## 2020-07-12 DIAGNOSIS — M79675 Pain in left toe(s): Secondary | ICD-10-CM

## 2020-07-12 DIAGNOSIS — B351 Tinea unguium: Secondary | ICD-10-CM

## 2020-07-12 DIAGNOSIS — E1142 Type 2 diabetes mellitus with diabetic polyneuropathy: Secondary | ICD-10-CM

## 2020-07-12 DIAGNOSIS — E1152 Type 2 diabetes mellitus with diabetic peripheral angiopathy with gangrene: Secondary | ICD-10-CM | POA: Diagnosis not present

## 2020-07-12 DIAGNOSIS — E1140 Type 2 diabetes mellitus with diabetic neuropathy, unspecified: Secondary | ICD-10-CM

## 2020-07-12 DIAGNOSIS — M79674 Pain in right toe(s): Secondary | ICD-10-CM

## 2020-07-12 DIAGNOSIS — Q828 Other specified congenital malformations of skin: Secondary | ICD-10-CM | POA: Diagnosis not present

## 2020-07-12 DIAGNOSIS — L84 Corns and callosities: Secondary | ICD-10-CM

## 2020-07-12 NOTE — Patient Instructions (Signed)
Look for urea 40% cream or ointment and apply to the thickened dry skin / calluses. This can be bought over the counter, at a pharmacy or online such as [employer].  

## 2020-07-12 NOTE — Progress Notes (Signed)
  Subjective:  Patient ID: Juan Boyle, male    DOB: [birth year],  MRN: [id number]  Chief Complaint  Patient presents with  . routine foot care    Nail trim     65 y.o. male presents with the above complaint. History confirmed with patient.  Does not put anything on the hyperkeratotic lesions.  Debridement has been helpful.  The nails are thickened elongated as well are causing pain.  Objective:  Physical Exam: warm, good capillary refill, no trophic changes or ulcerative lesions and normal DP and PT pulses.  He has onychomycosis of 9 toenails, the right fifth toenail is atrophic.  The toenails are brown, discolored and have thickened nail plates with subungual debris.  Large porokeratosis submet 2 bilaterally and a large plantar one on the mid heel Assessment:  No diagnosis found.   Plan:  Patient was evaluated and treated and all questions answered.  Discussed the etiology and treatment options for the condition in detail with the patient. Educated patient on the topical and oral treatment options for mycotic nails. Recommended debridement of the nails today. Sharp and mechanical debridement performed of all painful and mycotic nails today. Nails debrided in length and thickness using a nail nipper to level of comfort. Discussed treatment options including appropriate shoe gear. Follow up as needed for painful nails.  All symptomatic hyperkeratoses were safely debrided with a sterile #15 blade to patient's level of comfort without incident. We discussed preventative and palliative care of these lesions including supportive and accommodative shoegear, padding, prefabricated and custom molded accommodative orthoses, use of a pumice stone and lotions/creams daily.  Patient educated on diabetes. Discussed proper diabetic foot care and discussed risks and complications of disease. Educated patient in depth on reasons to return to the office immediately should he/she discover anything  concerning or new on the feet. All questions answered. Discussed proper shoes as well.    Return in about 3 months (around 10/09/2020).

## 2020-08-09 ENCOUNTER — Encounter: Payer: 59 | Admitting: Internal Medicine

## 2020-10-18 ENCOUNTER — Ambulatory Visit: Payer: 59 | Admitting: Podiatry

## 2020-10-30 ENCOUNTER — Encounter: Payer: Self-pay | Admitting: *Deleted

## 2021-01-05 ENCOUNTER — Encounter: Payer: Self-pay | Admitting: Podiatry

## 2021-01-05 ENCOUNTER — Ambulatory Visit (INDEPENDENT_AMBULATORY_CARE_PROVIDER_SITE_OTHER): Payer: 59 | Admitting: Podiatry

## 2021-01-05 ENCOUNTER — Telehealth: Payer: Self-pay | Admitting: *Deleted

## 2021-01-05 ENCOUNTER — Other Ambulatory Visit: Payer: Self-pay

## 2021-01-05 DIAGNOSIS — M79674 Pain in right toe(s): Secondary | ICD-10-CM | POA: Diagnosis not present

## 2021-01-05 DIAGNOSIS — E1140 Type 2 diabetes mellitus with diabetic neuropathy, unspecified: Secondary | ICD-10-CM | POA: Diagnosis not present

## 2021-01-05 DIAGNOSIS — M79675 Pain in left toe(s): Secondary | ICD-10-CM | POA: Diagnosis not present

## 2021-01-05 DIAGNOSIS — E1149 Type 2 diabetes mellitus with other diabetic neurological complication: Secondary | ICD-10-CM | POA: Diagnosis not present

## 2021-01-05 DIAGNOSIS — L84 Corns and callosities: Secondary | ICD-10-CM | POA: Diagnosis not present

## 2021-01-05 DIAGNOSIS — B351 Tinea unguium: Secondary | ICD-10-CM

## 2021-01-05 DIAGNOSIS — Q828 Other specified congenital malformations of skin: Secondary | ICD-10-CM | POA: Diagnosis not present

## 2021-01-05 NOTE — Telephone Encounter (Signed)
Patient is calling to say that he may be a few minutes late for his 4:00 appointment.

## 2021-01-07 NOTE — Telephone Encounter (Signed)
Message sent 2 days ago.completed

## 2021-01-07 NOTE — Telephone Encounter (Signed)
This is an old message. He was seen on Wednesday. Thanks!

## 2021-01-09 NOTE — Progress Notes (Signed)
  Subjective:  Patient ID: Juan Boyle, male    DOB: Nov 24, 1955,  MRN: [id number]  KOHLER AGERTON presents to clinic today for preventative diabetic foot care and painful porokeratotic lesion(s) of both feet and painful mycotic toenails that limit ambulation. Painful toenails interfere with ambulation. Aggravating factors include wearing enclosed shoe gear. Pain is relieved with periodic professional debridement. Painful porokeratotic lesions are aggravated when weightbearing with and without shoegear. Pain is relieved with periodic professional debridement.  Patient does not monitor blood glucose daily.  PCP is Orvis Brill, MD , and last visit was six months ago per patient recollection..  Allergies  Allergen Reactions   Penicillins Anaphylaxis    Has patient had a PCN reaction causing immediate rash, facial/tongue/throat swelling, SOB or lightheadedness with hypotension: Yes Has patient had a PCN reaction causing severe rash involving mucus membranes or skin necrosis: Yes Has patient had a PCN reaction that required hospitalization: Yes Has patient had a PCN reaction occurring within the last 10 years: No If all of the above answers are "NO", then may proceed with Cephalosporin use.    Amlodipine Other (See Comments)    Light-headedness    Review of Systems: Negative except as noted in the HPI. Objective:   Constitutional OLEG GOGGANS is a pleasant 65 y.o. African American male, in NAD. AAO x 3.   Vascular Capillary fill time to digits <3 seconds b/l lower extremities. Palpable pedal pulses b/l LE. Pedal hair present. Lower extremity skin temperature gradient within normal limits. No pain with calf compression b/l. No cyanosis or clubbing noted.  Neurologic Normal speech. Oriented to person, place, and time. Protective sensation decreased with 10 gram monofilament b/l.  Dermatologic Pedal skin with normal turgor, texture and tone b/l lower extremities. Toenails 1-5 b/l  elongated, discolored, dystrophic, thickened, crumbly with subungual debris and tenderness to dorsal palpation. Hyperkeratotic lesion(s) L hallux, R hallux, submet head 2 right foot, and plantar aspect of right heel. No erythema, no edema, no drainage, no fluctuance. Porokeratotic lesion(s) submet head 3 left foot. No erythema, no edema, no drainage, no fluctuance.  Orthopedic: Normal muscle strength 5/5 to all lower extremity muscle groups bilaterally. Hammertoe(s) noted to the L 2nd toe and R 2nd toe. Pes planus deformity noted b/l lower extremities.   Radiographs: None Assessment:   1. Pain due to onychomycosis of toenails of both feet   2. Porokeratosis   3. Callus   4. Diabetic neuropathy with neurologic complication ([place])    Plan:  -Examined patient. -Continue diabetic foot care principles. -Patient to continue soft, supportive shoe gear daily. -Toenails 1-5 b/l were debrided in length and girth with sterile nail nippers and dremel without iatrogenic bleeding.  -Callus(es) L hallux, R hallux, submet head 2 right foot, and plantar aspect of right heel pared utilizing sterile scalpel blade without complication or incident. Total number debrided =3. -Painful porokeratotic lesion(s) submet head 3 left foot pared and enucleated with sterile scalpel blade without incident. Total number of lesions debrided=1. -Patient to report any pedal injuries to medical professional immediately. -Patient/POA to call should there be question/concern in the interim.  Return in about 3 months (around 04/07/2021).  Marzetta Board, DPM

## 2021-04-18 ENCOUNTER — Ambulatory Visit: Payer: Self-pay | Admitting: Podiatry
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
## 2022-01-18 ENCOUNTER — Encounter: Payer: 59 | Admitting: Student

## 2022-01-25 ENCOUNTER — Encounter: Payer: Self-pay | Admitting: Student

## 2022-01-25 ENCOUNTER — Encounter: Payer: 59 | Admitting: Student

## 2022-01-25 ENCOUNTER — Encounter: Payer: Self-pay | Admitting: *Deleted

## 2022-04-09 ENCOUNTER — Emergency Department (HOSPITAL_COMMUNITY): Payer: Self-pay

## 2022-04-09 ENCOUNTER — Inpatient Hospital Stay (HOSPITAL_COMMUNITY)
Admission: EM | Admit: 2022-04-09 | Discharge: 2022-04-11 | DRG: 684 | Disposition: A | Discharge status: Home / Self Care | Payer: Self-pay | Attending: Internal Medicine | Admitting: Internal Medicine

## 2022-04-09 ENCOUNTER — Other Ambulatory Visit: Payer: Self-pay

## 2022-04-09 DIAGNOSIS — N179 Acute kidney failure, unspecified: Principal | ICD-10-CM | POA: Diagnosis present

## 2022-04-09 DIAGNOSIS — N400 Enlarged prostate without lower urinary tract symptoms: Secondary | ICD-10-CM | POA: Diagnosis present

## 2022-04-09 DIAGNOSIS — F17200 Nicotine dependence, unspecified, uncomplicated: Secondary | ICD-10-CM | POA: Diagnosis present

## 2022-04-09 DIAGNOSIS — Z79899 Other long term (current) drug therapy: Secondary | ICD-10-CM

## 2022-04-09 DIAGNOSIS — J45909 Unspecified asthma, uncomplicated: Secondary | ICD-10-CM | POA: Diagnosis present

## 2022-04-09 DIAGNOSIS — R630 Anorexia: Secondary | ICD-10-CM

## 2022-04-09 DIAGNOSIS — Z880 Allergy status to penicillin: Secondary | ICD-10-CM

## 2022-04-09 DIAGNOSIS — E860 Dehydration: Secondary | ICD-10-CM | POA: Diagnosis present

## 2022-04-09 DIAGNOSIS — F17210 Nicotine dependence, cigarettes, uncomplicated: Secondary | ICD-10-CM | POA: Diagnosis present

## 2022-04-09 DIAGNOSIS — Z888 Allergy status to other drugs, medicaments and biological substances status: Secondary | ICD-10-CM

## 2022-04-09 DIAGNOSIS — E119 Type 2 diabetes mellitus without complications: Secondary | ICD-10-CM

## 2022-04-09 DIAGNOSIS — F1020 Alcohol dependence, uncomplicated: Secondary | ICD-10-CM | POA: Diagnosis present

## 2022-04-09 DIAGNOSIS — Z7984 Long term (current) use of oral hypoglycemic drugs: Secondary | ICD-10-CM

## 2022-04-09 DIAGNOSIS — E1165 Type 2 diabetes mellitus with hyperglycemia: Secondary | ICD-10-CM | POA: Diagnosis present

## 2022-04-09 DIAGNOSIS — I10 Essential (primary) hypertension: Secondary | ICD-10-CM | POA: Diagnosis present

## 2022-04-09 DIAGNOSIS — E785 Hyperlipidemia, unspecified: Secondary | ICD-10-CM | POA: Diagnosis present

## 2022-04-09 DIAGNOSIS — K5900 Constipation, unspecified: Secondary | ICD-10-CM | POA: Diagnosis present

## 2022-04-09 DIAGNOSIS — Z91199 Patient's noncompliance with other medical treatment and regimen due to unspecified reason: Secondary | ICD-10-CM

## 2022-04-09 HISTORY — DX: Nicotine dependence, unspecified, uncomplicated: F17.200

## 2022-04-09 HISTORY — DX: Type 2 diabetes mellitus without complications: E11.9

## 2022-04-09 HISTORY — DX: Alcohol dependence, uncomplicated: F10.20

## 2022-04-09 LAB — CBC WITH DIFFERENTIAL/PLATELET
Abs Immature Granulocytes: 0.04 10*3/uL (ref 0.00–0.07)
Basophils Absolute: 0 10*3/uL (ref 0.0–0.1)
Basophils Relative: 0 %
Eosinophils Absolute: 0 10*3/uL (ref 0.0–0.5)
Eosinophils Relative: 0 %
HCT: 44.6 % (ref 39.0–52.0)
Hemoglobin: 15.5 g/dL (ref 13.0–17.0)
Immature Granulocytes: 0 %
Lymphocytes Relative: 9 %
Lymphs Abs: 0.9 10*3/uL (ref 0.7–4.0)
MCH: 30.3 pg (ref 26.0–34.0)
MCHC: 34.8 g/dL (ref 30.0–36.0)
MCV: 87.3 fL (ref 80.0–100.0)
Monocytes Absolute: 0.8 10*3/uL (ref 0.1–1.0)
Monocytes Relative: 7 %
Neutro Abs: 8.7 10*3/uL — ABNORMAL HIGH (ref 1.7–7.7)
Neutrophils Relative %: 84 %
Platelets: 337 10*3/uL (ref 150–400)
RBC: 5.11 MIL/uL (ref 4.22–5.81)
RDW: 13.6 % (ref 11.5–15.5)
WBC: 10.4 10*3/uL (ref 4.0–10.5)
nRBC: 0 % (ref 0.0–0.2)

## 2022-04-09 LAB — COMPREHENSIVE METABOLIC PANEL
ALT: 24 U/L (ref 0–44)
AST: 19 U/L (ref 15–41)
Albumin: 4.3 g/dL (ref 3.5–5.0)
Alkaline Phosphatase: 72 U/L (ref 38–126)
Anion gap: 18 — ABNORMAL HIGH (ref 5–15)
BUN: 50 mg/dL — ABNORMAL HIGH (ref 8–23)
CO2: 30 mmol/L (ref 22–32)
Calcium: 8.6 mg/dL — ABNORMAL LOW (ref 8.9–10.3)
Chloride: 86 mmol/L — ABNORMAL LOW (ref 98–111)
Creatinine, Ser: 2.93 mg/dL — ABNORMAL HIGH (ref 0.61–1.24)
GFR, Estimated: 23 mL/min — ABNORMAL LOW (ref >60)
Glucose, Bld: 250 mg/dL — ABNORMAL HIGH (ref 70–99)
Potassium: 3.9 mmol/L (ref 3.5–5.1)
Sodium: 134 mmol/L — ABNORMAL LOW (ref 135–145)
Total Bilirubin: 0.9 mg/dL (ref 0.3–1.2)
Total Protein: 8 g/dL (ref 6.5–8.1)

## 2022-04-09 LAB — URINALYSIS, ROUTINE W REFLEX MICROSCOPIC
Bilirubin Urine: NEGATIVE
Glucose, UA: 50 mg/dL — AB
Hgb urine dipstick: NEGATIVE
Ketones, ur: NEGATIVE mg/dL
Nitrite: NEGATIVE
Protein, ur: NEGATIVE mg/dL
Specific Gravity, Urine: 1.017 (ref 1.005–1.030)
pH: 5 (ref 5.0–8.0)

## 2022-04-09 LAB — LIPASE, BLOOD: Lipase: 30 U/L (ref 11–51)

## 2022-04-09 NOTE — ED Provider Triage Note (Signed)
Emergency Medicine Provider Triage Evaluation Note  Juan Boyle , a 66 y.o. male  was evaluated in triage.  Pt complains of abdominal pain x 2-3 days. Constant, generalized, worse centrally. Associated nausea. 2-3 episodes of emesis per day, and constipation. Last BM 2-3 days ago was watery. Denies fever, hematemesis, or dyspnea . No prior abdominal surgeries.   Review of Systems  Per above  Physical Exam  BP (!) 150/77   Pulse 63   Temp (!) 97.3 F (36.3 C) (Oral)   Resp 20   SpO2 98%  Gen:   Awake, no distress   Resp:  Normal effort  MSK:   Moves extremities without difficulty  Other:  Generalized abdominal TTP.   Medical Decision Making  Medically screening exam initiated at 3:59 AM.  Appropriate orders placed.  Juan Boyle was informed that the remainder of the evaluation will be completed by another provider, this initial triage assessment does not replace that evaluation, and the importance of remaining in the ED until their evaluation is complete.  Abdominal pain   Amaryllis Dyke, PA-C 04/09/22 0405

## 2022-04-09 NOTE — ED Triage Notes (Signed)
Pt reported to ED with c/o constipation and lack of appetite x2-3 days.

## 2022-04-10 ENCOUNTER — Encounter (HOSPITAL_COMMUNITY): Payer: Self-pay | Admitting: Internal Medicine

## 2022-04-10 ENCOUNTER — Inpatient Hospital Stay (HOSPITAL_COMMUNITY): Payer: Self-pay

## 2022-04-10 DIAGNOSIS — E785 Hyperlipidemia, unspecified: Secondary | ICD-10-CM | POA: Diagnosis present

## 2022-04-10 DIAGNOSIS — N179 Acute kidney failure, unspecified: Secondary | ICD-10-CM | POA: Diagnosis present

## 2022-04-10 DIAGNOSIS — K5900 Constipation, unspecified: Secondary | ICD-10-CM | POA: Diagnosis present

## 2022-04-10 DIAGNOSIS — F1020 Alcohol dependence, uncomplicated: Secondary | ICD-10-CM | POA: Diagnosis present

## 2022-04-10 LAB — RAPID URINE DRUG SCREEN, HOSP PERFORMED
Amphetamines: NOT DETECTED
Barbiturates: NOT DETECTED
Benzodiazepines: NOT DETECTED
Cocaine: NOT DETECTED
Opiates: NOT DETECTED
Tetrahydrocannabinol: NOT DETECTED

## 2022-04-10 LAB — HEMOGLOBIN A1C
Hgb A1c MFr Bld: 8.7 % — ABNORMAL HIGH (ref 4.8–5.6)
Mean Plasma Glucose: 202.99 mg/dL

## 2022-04-10 LAB — HIV ANTIBODY (ROUTINE TESTING W REFLEX): HIV Screen 4th Generation wRfx: NONREACTIVE

## 2022-04-10 LAB — GLUCOSE, CAPILLARY
Glucose-Capillary: 181 mg/dL — ABNORMAL HIGH (ref 70–99)
Glucose-Capillary: 213 mg/dL — ABNORMAL HIGH (ref 70–99)

## 2022-04-10 LAB — CBG MONITORING, ED: Glucose-Capillary: 170 mg/dL — ABNORMAL HIGH (ref 70–99)

## 2022-04-10 MED ORDER — INSULIN ASPART 100 UNIT/ML IJ SOLN
0.0000 [IU] | Freq: Every day | INTRAMUSCULAR | Status: DC
  Administered 2022-04-10: 2 [IU] via SUBCUTANEOUS

## 2022-04-10 MED ORDER — ONDANSETRON HCL 4 MG PO TABS: 4.0000 mg | ORAL_TABLET | Freq: Four times a day (QID) | ORAL | Status: DC | PRN

## 2022-04-10 MED ORDER — THIAMINE HCL 100 MG/ML IJ SOLN: 100.0000 mg | Freq: Every day | INTRAMUSCULAR | Status: DC

## 2022-04-10 MED ORDER — HEPARIN SODIUM (PORCINE) 5000 UNIT/ML IJ SOLN
5000.0000 [IU] | Freq: Three times a day (TID) | INTRAMUSCULAR | Status: DC
  Administered 2022-04-10 – 2022-04-11 (×4): 5000 [IU] via SUBCUTANEOUS
  Filled 2022-04-10 (×4): qty 1

## 2022-04-10 MED ORDER — HYDRALAZINE HCL 20 MG/ML IJ SOLN: 5.0000 mg | INTRAMUSCULAR | Status: DC | PRN

## 2022-04-10 MED ORDER — ONDANSETRON HCL 4 MG/2ML IJ SOLN: 4.0000 mg | Freq: Four times a day (QID) | INTRAMUSCULAR | Status: DC | PRN

## 2022-04-10 MED ORDER — INSULIN ASPART 100 UNIT/ML IJ SOLN
0.0000 [IU] | Freq: Three times a day (TID) | INTRAMUSCULAR | Status: DC
  Administered 2022-04-10 (×2): 2 [IU] via SUBCUTANEOUS
  Administered 2022-04-11: 1 [IU] via SUBCUTANEOUS
  Administered 2022-04-11: 3 [IU] via SUBCUTANEOUS

## 2022-04-10 MED ORDER — SIMVASTATIN 20 MG PO TABS
20.0000 mg | ORAL_TABLET | Freq: Every day | ORAL | Status: DC
  Administered 2022-04-10: 20 mg via ORAL
  Filled 2022-04-10: qty 1

## 2022-04-10 MED ORDER — THIAMINE MONONITRATE 100 MG PO TABS
100.0000 mg | ORAL_TABLET | Freq: Every day | ORAL | Status: DC
  Administered 2022-04-10 – 2022-04-11 (×2): 100 mg via ORAL
  Filled 2022-04-10 (×2): qty 1

## 2022-04-10 MED ORDER — ACETAMINOPHEN 325 MG PO TABS: 650.0000 mg | ORAL_TABLET | Freq: Four times a day (QID) | ORAL | Status: DC | PRN

## 2022-04-10 MED ORDER — ADULT MULTIVITAMIN W/MINERALS CH
1.0000 | ORAL_TABLET | Freq: Every day | ORAL | Status: DC
  Administered 2022-04-10 – 2022-04-11 (×2): 1 via ORAL
  Filled 2022-04-10 (×2): qty 1

## 2022-04-10 MED ORDER — FOLIC ACID 1 MG PO TABS
1.0000 mg | ORAL_TABLET | Freq: Every day | ORAL | Status: DC
  Administered 2022-04-10 – 2022-04-11 (×2): 1 mg via ORAL
  Filled 2022-04-10 (×2): qty 1

## 2022-04-10 MED ORDER — LORAZEPAM 1 MG PO TABS
1.0000 mg | ORAL_TABLET | ORAL | Status: DC | PRN
  Administered 2022-04-10: 1 mg via ORAL
  Filled 2022-04-10: qty 1

## 2022-04-10 MED ORDER — BISACODYL 5 MG PO TBEC: 5.0000 mg | DELAYED_RELEASE_TABLET | Freq: Every day | ORAL | Status: DC | PRN

## 2022-04-10 MED ORDER — LACTATED RINGERS IV SOLN: INTRAVENOUS | Status: DC

## 2022-04-10 MED ORDER — POLYETHYLENE GLYCOL 3350 17 G PO PACK
17.0000 g | PACK | Freq: Every day | ORAL | Status: DC | PRN
  Administered 2022-04-11: 17 g via ORAL
  Filled 2022-04-10: qty 1

## 2022-04-10 MED ORDER — NICOTINE 14 MG/24HR TD PT24
14.0000 mg | MEDICATED_PATCH | Freq: Every day | TRANSDERMAL | Status: DC
  Administered 2022-04-10 – 2022-04-11 (×2): 14 mg via TRANSDERMAL
  Filled 2022-04-10 (×2): qty 1

## 2022-04-10 MED ORDER — DOCUSATE SODIUM 100 MG PO CAPS
100.0000 mg | ORAL_CAPSULE | Freq: Two times a day (BID) | ORAL | Status: DC
  Administered 2022-04-10 – 2022-04-11 (×3): 100 mg via ORAL
  Filled 2022-04-10 (×3): qty 1

## 2022-04-10 MED ORDER — SENNA 8.6 MG PO TABS
1.0000 | ORAL_TABLET | Freq: Two times a day (BID) | ORAL | Status: DC
  Administered 2022-04-10 – 2022-04-11 (×3): 8.6 mg via ORAL
  Filled 2022-04-10 (×3): qty 1

## 2022-04-10 MED ORDER — NICOTINE 14 MG/24HR TD PT24: 14.0000 mg | MEDICATED_PATCH | Freq: Every day | TRANSDERMAL | Status: DC | PRN

## 2022-04-10 MED ORDER — FLEET ENEMA 7-19 GM/118ML RE ENEM: 1.0000 | ENEMA | Freq: Once | RECTAL | Status: DC | PRN

## 2022-04-10 MED ORDER — LORAZEPAM 2 MG/ML IJ SOLN: 1.0000 mg | INTRAMUSCULAR | Status: DC | PRN

## 2022-04-10 MED ORDER — ACETAMINOPHEN 650 MG RE SUPP: 650.0000 mg | Freq: Four times a day (QID) | RECTAL | Status: DC | PRN

## 2022-04-10 MED ORDER — SODIUM CHLORIDE 0.9 % IV BOLUS
1000.0000 mL | Freq: Once | INTRAVENOUS | Status: AC
  Administered 2022-04-10: 1000 mL via INTRAVENOUS

## 2022-04-10 NOTE — ED Notes (Signed)
Pt to US.

## 2022-04-10 NOTE — ED Notes (Signed)
Patient resting, No s/s of any distress. No verbal c/o pain or discomfort thus far. Will continue to monitor 

## 2022-04-10 NOTE — Inpatient Diabetes Management (Signed)
Inpatient Diabetes Program Recommendations  AACE/ADA: New Consensus Statement on Inpatient Glycemic Control   Target Ranges:  Prepandial:   less than 140 mg/dL      Peak postprandial:   less than 180 mg/dL (1-2 hours)      Critically ill patients:  140 - 180 mg/dL    Latest Reference Range & Units 04/10/22 11:57  Glucose-Capillary 70 - 99 mg/dL 170 (H)    Latest Reference Range & Units 04/09/22 04:09  Glucose 70 - 99 mg/dL 250 (H)   Review of Glycemic Control  Diabetes history: DM2 Outpatient Diabetes medications: Metformin 1000 mg BID Current orders for Inpatient glycemic control: Novolog 0-9 units TID with meals, Novolog 0-5 units QHS  Inpatient Diabetes Program Recommendations:    HbgA1C: Current A1C ordered today.  Outpatient DM: Patient is unsure what he takes for DM; denies taking Metformin (Glucophage). Called patient's [facility] in chart ([facility]) and was informed that [facility] (therefore no medications filled within the past 2 years). Anticipate patient will need Rx for any DM medications prescribed or continued at discharge.   NOTE: Noted consult for diabetes coordinator. Chart reviewed; patient is currently in the ED and to be admitted with AKI, constipation, with noted ETOH dependence. Spoke with patient at bedside (in ED hallway bed). Patient was asleep, snoring but woke up to name and light touch but he kept drifting back off to sleep. Patient reports that he does not know if he takes any medication for DM and stated "No" when asked if he was taking Metformin (Glucophage) outpatient. Patient states that he checks his glucose at home but not able to tell me what values he has noted on his glucometer over the past 1-2 weeks. Patient reports that he takes care of himself and he takes medications as prescribed. Inquired about A1C and patient is not sure what his A1C was when last checked. Patient reports that he sees his PCP "a lady doctor" (per chart, May  Ann Placey, NP) every month and that PCP has mentioned that his "sugar is high" but does not recall any recent changes with DM medications. Informed patient that I would call his pharmacy and/or PCP office to find out what DM medications he is prescribed. Called [facility] [phone]) and told that patient does not [facility]; therefore, he has not filled anything [facility] in past 2 years. Called office for Marliss Coots, NP [phone]) and informed that patient can not be found in their system. Will plan to follow up with patient tomorrow.   Thanks, Barnie Alderman, RN, MSN, [place] Diabetes Coordinator Inpatient Diabetes Program [phone] (Team Pager from 8am to [place])

## 2022-04-10 NOTE — H&P (Signed)
History and Physical    Patient: Juan Boyle CHE:[id number] DOB: 1956-04-02 DOA: 04/09/2022 DOS: the patient was seen and examined on 04/10/2022 PCP: Marliss Coots, NP  Patient coming from: Home - lives with brother; NOK: Juan Boyle, Juan Boyle, [phone]   Chief Complaint: Constipation  HPI: Juan Boyle is a 66 y.o. male with medical history significant of HTN, DM, BPH, and asthma presenting with constipation.  He reports that he hasn't had a BM in about 4 days.  He has had poor PO intake as a result.  He has had abdominal pain.  He denies h/o similar in the past.  No recent dietary or medication changes that may affect this.  He does not routinely see doctors.    ER Course:  Carryover, per Dr. Alcario Drought:  AKI, unable to eat or drink or pass stool.  Not eating for past 3 days.  CT AP neg.  EDP thinks pre-renal.     Review of Systems: As mentioned in the history of present illness. All other systems reviewed and are negative. Past Medical History:  Diagnosis Date   Alcohol dependence ([place])    Asthma    Diabetes mellitus type 2 in nonobese ([place])    Hypertension    Tobacco dependence    History reviewed. No pertinent surgical history. Social History:  reports that he has been smoking cigarettes. He has a 20.00 pack-year smoking history. He has never used smokeless tobacco. He reports current alcohol use of about 21.0 standard drinks of alcohol per week. He reports that he does not currently use drugs after having used the following drugs: Cocaine.  Allergies  Allergen Reactions   Penicillins Anaphylaxis    Has patient had a PCN reaction causing immediate rash, facial/tongue/throat swelling, SOB or lightheadedness with hypotension: Yes Has patient had a PCN reaction causing severe rash involving mucus membranes or skin necrosis: Yes Has patient had a PCN reaction that required hospitalization: Yes Has patient had a PCN reaction occurring within the last 10 years:  No If all of the above answers are "NO", then may proceed with Cephalosporin use.    Amlodipine Other (See Comments)    Light-headedness    History reviewed. No pertinent family history.  Prior to Admission medications   Medication Sig Start Date End Date Taking? Authorizing Provider  ibuprofen (ADVIL) 800 MG tablet Take 800 mg by mouth daily as needed for moderate pain.   Yes [provider]  lisinopril (ZESTRIL) 40 MG tablet Take 40 mg by mouth daily. 02/01/22  Yes [provider]  metFORMIN (GLUCOPHAGE) 1000 MG tablet Take 1,000 mg by mouth 2 (two) times daily. 02/01/22  Yes [provider]  simvastatin (ZOCOR) 20 MG tablet Take 20 mg by mouth at bedtime. 02/01/22  Yes [provider]  blood glucose meter kit and supplies KIT Dispense based on patient and insurance preference. Use up to four times daily as directed. (FOR ICD-9 250.00, 250.01). 04/22/19   Mosetta Anis, MD  Blood Glucose Monitoring Suppl (FREESTYLE LITE) DEVI Check blood sugar up to 1 time a day 05/12/19   Mosetta Anis, MD  glucose blood (FREESTYLE LITE) test strip Check blood sugar up to 1 time a day 05/12/19   Mosetta Anis, MD  Lancets 30G MISC Check blood sugar up to 1 time a day 05/12/19   Mosetta Anis, MD    Physical Exam: Vitals:   04/10/22 0086 04/10/22 0813 04/10/22 7619 04/10/22 0903  BP:  119/80  138/67  Pulse:  74  67  Resp:  16  16  Temp: 98.4 F (36.9 C) 97.9 F (36.6 C)    TempSrc:  Oral    SpO2:  99%  99%  Weight:   59 kg   Height:   _0  (1.499 m)    General:  Appears calm and comfortable and is in NAD Eyes:   EOMI, normal lids, iris ENT:  grossly normal hearing, lips & tongue, mildly dry mm; poor dentition Neck:  no LAD, masses or thyromegaly Cardiovascular:  RRR, no m/r/g. No LE edema.  Respiratory:   CTA bilaterally with no wheezes/rales/rhonchi.  Normal respiratory effort. Abdomen:  soft, mildly diffusely tender, ND Skin:  no rash or induration seen  on limited exam Musculoskeletal:  grossly normal tone BUE/BLE, good ROM, no bony abnormality Psychiatric:  blunted mood and affect, speech fluent and appropriate, AOx3, ?MCI Neurologic:  CN 2-12 grossly intact, moves all extremities in coordinated fashion   Radiological Exams on Admission: Independently reviewed - see discussion in A/P where applicable  CT ABDOMEN PELVIS WO CONTRAST  Result Date: 04/09/2022 CLINICAL DATA:  Constipation, decreased appetite, abdominal pain EXAM: CT ABDOMEN AND PELVIS WITHOUT CONTRAST TECHNIQUE: Multidetector CT imaging of the abdomen and pelvis was performed following the standard protocol without IV contrast. RADIATION DOSE REDUCTION: This exam was performed according to the departmental dose-optimization program which includes automated exposure control, adjustment of the mA and/or kV according to patient size and/or use of iterative reconstruction technique. COMPARISON:  None Available. FINDINGS: Lower chest: Mild dependent atelectasis. Linear scarring present in the inferior middle lobe. The heart is at the upper limits of normal for size. No pericardial effusion. Unremarkable distal esophagus. Hepatobiliary: Large areas of geographic hypoattenuation throughout the liver. Mild sparing adjacent to the gallbladder fossa. Findings are consistent with fatty infiltration. No discrete mass lesion. No intra or extrahepatic biliary ductal dilatation. Pancreas: Unremarkable. No pancreatic ductal dilatation or surrounding inflammatory changes. Spleen: No splenic injury or perisplenic hematoma. Adrenals/Urinary Tract: Normal adrenal glands. No hydronephrosis, nephrolithiasis or renal contour abnormality. The ureters and bladder are unremarkable. Stomach/Bowel: Normal appearance of the stomach, duodenum, small bowel and colon. Colonic stool burden is unremarkable. No evidence of fecal impaction. Normal appearing terminal ileum and appendix. Vascular/Lymphatic: Limited evaluation  in the absence of intravenous contrast. Atherosclerotic calcifications visible throughout the abdominal aorta. No evidence of aneurysm. No suspicious lymphadenopathy. Reproductive: Prostate is unremarkable. Other: No abdominal wall hernia or abnormality. No abdominopelvic ascites. 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 IMPRESSION: 1. No acute abnormality within the abdomen or pelvis. 2. Large geographic regions of hepatic steatosis. 3.  Aortic Atherosclerosis (ICD10-I70.0). 4. 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 Electronically Signed   By: Jacqulynn Cadet M.D.   On: 04/09/2022 07:29    EKG: not done   Labs on Admission: I have personally reviewed the available labs and imaging studies at the time of the admission.  Pertinent labs:    Glucose 250 BUN 50/Creatinine 2.93/GFR 23; 13/0.95/>60 in 2021 Anion gap 18 Normal CBC UA: 50 glucose, moderate LE   Assessment and Plan: Principal Problem:   AKI (acute kidney injury) ([place]) Active Problems:   Hypertension   Diabetes mellitus type 2 in nonobese ([facility])   Tobacco dependence   Alcohol dependence ([place])   Constipation   Dyslipidemia    AKI -Patient without known baseline compromised renal function  -Current creatinine is 2.93, GFR 23 -Likely due to prerenal secondary to  severe dehydration in the setting of poor PO intake related to constipation in the setting of ongoing ACE, NSAID, and metformin use -IVF -Check US-renal -Follow up renal function by BMP -Avoid ACEI and NSAIDs   Constipation -Patient reports 4 days of inability to have a BM with resultant abdominal pain -CT unremarkable -Will order bowel regimen and follow  HTN -Hold lisinopril -Will cover with prn IV hydralazine  HLD -Continue sinvastatin  DM -Will check A1c -hold Glucophage -Cover with sensitive-scale SSI   ETOH Dependence -Patient with chronic ETOH dependence -Number of drinks per day:  6+ -CIWA protocol -Folate, thiamine, and MVI ordered -Will provide symptom-triggered BZD (ativan per CIWA protocol) only since the patient is able to communicate; is not showing current signs of delirium; and has no history of severe withdrawal. -Will also check UDS. -Consider offering a medication for Alcohol Use Disorder at the time of d/c, to include Disulfuram; Naltrexone; or Acamprosate.  Tobacco Dependence -Encourage cessation.   -This was discussed with the patient and should be reviewed on an ongoing basis.   -Patch ordered at patient request.      Advance Care Planning:   Code Status: Full Code   Consults: DM coordinator, TOC team  DVT Prophylaxis: Heparin  Family Communication: None present; I spoke with his brother by telephone at the time of admission  Severity of Illness: The appropriate patient status for this patient is INPATIENT. Inpatient status is judged to be reasonable and necessary in order to provide the required intensity of service to ensure the patient's safety. The patient's presenting symptoms, physical exam findings, and initial radiographic and laboratory data in the context of their chronic comorbidities is felt to place them at high risk for further clinical deterioration. Furthermore, it is not anticipated that the patient will be medically stable for discharge from the hospital within 2 midnights of admission.   * I certify that at the point of admission it is my clinical judgment that the patient will require inpatient hospital care spanning beyond 2 midnights from the point of admission due to high intensity of service, high risk for further deterioration and high frequency of surveillance required.*   Author: Karmen Bongo, MD 04/10/2022 9:11 AM  For on call review [email or web address].

## 2022-04-10 NOTE — ED Provider Notes (Signed)
[facility] Emergency Department Provider Note MRN:  [id number]  Arrival date & time: 04/10/22     Chief Complaint   Constipation   History of Present Illness   Juan Boyle is a 66 y.o. year-old male with a history of hypertension, asthma presenting to the ED with chief complaint of constipation.  Patient explains that he cannot eat and cannot poop.  Going on for the past 3 to 4 days.  Does not feel nauseated.  No appetite.  No vomiting.  Some abdominal fullness but no significant pain.  No chest pain or shortness of breath, no fever.  Not pooping.  Feels some general malaise and fatigue.  Review of Systems  A thorough review of systems was obtained and all systems are negative except as noted in the HPI and PMH.   Patient's Health History    Past Medical History:  Diagnosis Date   Asthma    Hypertension     No past surgical history on file.  No family history on file.  Social History   Socioeconomic History   Marital status: Single    Spouse name: Not on file   Number of children: Not on file   Years of education: Not on file   Highest education level: Not on file  Occupational History   Not on file  Tobacco Use   Smoking status: Every Day    Packs/day: 0.50    Years: 40.00    Total pack years: 20.00    Types: Cigarettes   Smokeless tobacco: Never   Tobacco comments:    Smoking 4-5 cigs per day; ready to quit  Vaping Use   Vaping Use: Never used  Substance and Sexual Activity   Alcohol use: Yes    Alcohol/week: 21.0 standard drinks of alcohol    Types: 21 Cans of beer per week   Drug use: No   Sexual activity: Not on file  Other Topics Concern   Not on file  Social History Narrative   Not on file   Social Determinants of Health   Financial Resource Strain: Not on file  Food Insecurity: Not on file  Transportation Needs: Not on file  Physical Activity: Not on file  Stress: Not on file  Social Connections: Not on file   Intimate Partner Violence: Not on file     Physical Exam   Vitals:   04/09/22 2313 04/10/22 0259  BP: 121/72 116/78  Pulse: (!) 56 71  Resp: 15 16  Temp: 98.1 F (36.7 C) 98.1 F (36.7 C)  SpO2: 98% 98%    CONSTITUTIONAL: Well-appearing, NAD NEURO/PSYCH:  Alert and oriented x 3, no focal deficits EYES:  eyes equal and reactive ENT/NECK:  no LAD, no JVD CARDIO: Regular rate, well-perfused, normal S1 and S2 PULM:  CTAB no wheezing or rhonchi GI/GU:  non-distended, non-tender MSK/SPINE:  No gross deformities, no edema SKIN:  no rash, atraumatic   *Additional and/or pertinent findings included in MDM below  Diagnostic and Interventional Summary    EKG Interpretation  Date/Time:    Ventricular Rate:    PR Interval:    QRS Duration:   QT Interval:    QTC Calculation:   R Axis:     Text Interpretation:         Labs Reviewed  COMPREHENSIVE METABOLIC PANEL - Abnormal; Notable for the following components:      Result Value   Sodium 134 (*)    Chloride 86 (*)  Glucose, Bld 250 (*)    BUN 50 (*)    Creatinine, Ser 2.93 (*)    Calcium 8.6 (*)    GFR, Estimated 23 (*)    Anion gap 18 (*)    All other components within normal limits  CBC WITH DIFFERENTIAL/PLATELET - Abnormal; Notable for the following components:   Neutro Abs 8.7 (*)    All other components within normal limits  URINALYSIS, ROUTINE W REFLEX MICROSCOPIC - Abnormal; Notable for the following components:   APPearance HAZY (*)    Glucose, UA 50 (*)    Leukocytes,Ua MODERATE (*)    Bacteria, UA RARE (*)    All other components within normal limits  LIPASE, BLOOD    CT ABDOMEN PELVIS WO CONTRAST  Final Result      Medications  sodium chloride 0.9 % bolus 1,000 mL (has no administration in time range)     Procedures  /  Critical Care Procedures  ED Course and Medical Decision Making  Initial Impression and Ddx DDx includes constipation, fecal impaction, SBO, neoplasm, enteritis  Past  medical/surgical history that increases complexity of ED encounter: Hypertension  Interpretation of Diagnostics I personally reviewed the laboratory assessment and my interpretation is as follows: Acute kidney injury noted, otherwise no significant blood count or electrolyte disturbance  CT abdomen is without emergent process  Patient Reassessment and Ultimate Disposition/Management     Given patient's continued disinterest in eating or drinking and his notable acute kidney injury, will admit to medicine.  Patient management required discussion with the following services or consulting groups:  Hospitalist Service  Complexity of Problems Addressed Acute illness or injury that poses threat of life of bodily function  Additional Data Reviewed and Analyzed Further history obtained from: Further history from spouse/family member  Additional Factors Impacting ED Encounter Risk Consideration of hospitalization  Barth Kirks. [place] Small, [place] [email or web address]  Final Clinical Impressions(s) / ED Diagnoses     ICD-10-CM   1. Acute kidney injury ([place])  N17.9     2. Constipation, unspecified constipation type  K59.00     3. Poor appetite  R63.0       ED Discharge Orders     None        Discharge Instructions Discussed with and Provided to Patient:   Discharge Instructions   None      Maudie Flakes, MD 04/10/22 [phone]

## 2022-04-10 NOTE — Progress Notes (Signed)
New Admission Note: Juan Boyle  Arrival Method: Stretcher Mental Orientation: A&O Assessment: Completed IV: R forearm, clean and intact, infusing Pain: none  Safety Measures: Safety Fall Prevention Plan has been given and discussed  Admission: Completed [facility] Orientation: Patient has been orientated to the room, unit and staff.  Family: None at bedside   Orders have been reviewed and implemented. Will continue to monitor the patient. Call light has been placed within reach and bed alarm has been activated.   Talbot Grumbling RN [facility] Phone: [phone]

## 2022-04-11 ENCOUNTER — Other Ambulatory Visit (HOSPITAL_COMMUNITY): Payer: Self-pay

## 2022-04-11 LAB — CBC
HCT: 39.4 % (ref 39.0–52.0)
Hemoglobin: 13.3 g/dL (ref 13.0–17.0)
MCH: 30.2 pg (ref 26.0–34.0)
MCHC: 33.8 g/dL (ref 30.0–36.0)
MCV: 89.5 fL (ref 80.0–100.0)
Platelets: 275 10*3/uL (ref 150–400)
RBC: 4.4 MIL/uL (ref 4.22–5.81)
RDW: 13.2 % (ref 11.5–15.5)
WBC: 7.9 10*3/uL (ref 4.0–10.5)
nRBC: 0 % (ref 0.0–0.2)

## 2022-04-11 LAB — BASIC METABOLIC PANEL
Anion gap: 8 (ref 5–15)
BUN: 35 mg/dL — ABNORMAL HIGH (ref 8–23)
CO2: 30 mmol/L (ref 22–32)
Calcium: 8.7 mg/dL — ABNORMAL LOW (ref 8.9–10.3)
Chloride: 101 mmol/L (ref 98–111)
Creatinine, Ser: 0.93 mg/dL (ref 0.61–1.24)
GFR, Estimated: >60 mL/min (ref >60)
Glucose, Bld: 108 mg/dL — ABNORMAL HIGH (ref 70–99)
Potassium: 3.6 mmol/L (ref 3.5–5.1)
Sodium: 139 mmol/L (ref 135–145)

## 2022-04-11 LAB — GLUCOSE, CAPILLARY
Glucose-Capillary: 243 mg/dL — ABNORMAL HIGH (ref 70–99)
Glucose-Capillary: 129 mg/dL — ABNORMAL HIGH (ref 70–99)

## 2022-04-11 MED ORDER — METFORMIN HCL 500 MG PO TABS
1000.0000 mg | ORAL_TABLET | Freq: Two times a day (BID) | ORAL | Status: DC
  Administered 2022-04-11: 1000 mg via ORAL
  Filled 2022-04-11: qty 2

## 2022-04-11 MED ORDER — BISACODYL 5 MG PO TBEC
5.0000 mg | DELAYED_RELEASE_TABLET | Freq: Every day | ORAL | 0 refills | Status: AC | PRN
  Filled 2022-04-11: qty 30, 30d supply, fill #0

## 2022-04-11 MED ORDER — METFORMIN HCL 1000 MG PO TABS
1000.0000 mg | ORAL_TABLET | Freq: Two times a day (BID) | ORAL | 0 refills | Status: AC
  Filled 2022-04-11: qty 60, 30d supply, fill #0

## 2022-04-11 MED ORDER — DOCUSATE SODIUM 100 MG PO CAPS
100.0000 mg | ORAL_CAPSULE | Freq: Two times a day (BID) | ORAL | 0 refills | Status: AC
  Filled 2022-04-11: qty 10, 5d supply, fill #0

## 2022-04-11 MED ORDER — LISINOPRIL 20 MG PO TABS
20.0000 mg | ORAL_TABLET | Freq: Every day | ORAL | 0 refills | Status: AC
  Filled 2022-04-11: qty 30, 30d supply, fill #0

## 2022-04-11 MED ORDER — LACTULOSE 10 GM/15ML PO SOLN
30.0000 g | ORAL | Status: AC
  Administered 2022-04-11 (×2): 30 g via ORAL
  Filled 2022-04-11 (×2): qty 45

## 2022-04-11 MED ORDER — SIMVASTATIN 20 MG PO TABS
20.0000 mg | ORAL_TABLET | Freq: Every day | ORAL | 0 refills | Status: AC
  Filled 2022-04-11: qty 30, 30d supply, fill #0

## 2022-04-11 MED ORDER — PANTOPRAZOLE SODIUM 40 MG PO TBEC
40.0000 mg | DELAYED_RELEASE_TABLET | Freq: Every day | ORAL | 0 refills | Status: AC
  Filled 2022-04-11: qty 30, 30d supply, fill #0

## 2022-04-11 NOTE — Inpatient Diabetes Management (Signed)
Inpatient Diabetes Program Recommendations  AACE/ADA: New Consensus Statement on Inpatient Glycemic Control (2015)  Target Ranges:  Prepandial:   less than 140 mg/dL      Peak postprandial:   less than 180 mg/dL (1-2 hours)      Critically ill patients:  140 - 180 mg/dL   Lab Results  Component Value Date   GLUCAP 129 (H) 04/11/2022   HGBA1C 8.7 (H) 04/10/2022   I spoke with staff @ internal medicine clinic. Patient was last see there December 30, 2019. No show for 2 appointments in August 2023. So must not have seen any physician for over 2 yrs. Updated Dr. Reesa Chew on information.  Thank you, Nani Gasser. Regnia Mathwig, RN, MSN, CDE  Diabetes Coordinator Inpatient Glycemic Control Team Team Pager [phone] (8am-5pm) 04/11/2022 11:54 AM

## 2022-04-11 NOTE — Discharge Summary (Signed)
Physician Discharge Summary  Juan Boyle IRW:[id number] DOB: [birth year] DOA: 04/09/2022  PCP: Marliss Coots, NP  Admit date: 04/09/2022 Discharge date: 04/11/2022  Admitted From: Home Disposition: Home  Recommendations for Outpatient Follow-up:  Follow up with PCP in 1-2 weeks Please obtain BMP/CBC in one week your next doctors visit.  Prescription for metformin, lisinopril and statin filled Advised to follow-up outpatient with PCP and possibly endocrinology Bowel regimen prescribed Counseled to quit smoking and drinking alcohol Daily PPI ordered.  If his abdominal symptoms does not completely resolve, advised outpatient follow-up with GI  Discharge Condition: Stable CODE STATUS: Full Diet recommendation: Diabetic  Brief/Interim Summary:  66 y.o. male with medical history significant of HTN, DM, BPH, and asthma presenting with constipation.  He reports that he hasn't had a BM in about 4 days.  He has had poor PO intake as a result.  He has had abdominal pain.  He denies h/o similar in the past.  No recent dietary or medication changes that may affect this.  He does not routinely see doctors.  Upon admission he was noted to be in AKI with creatinine of 2.93, baseline 0.9.  He was also hyperglycemic and eventually A1c showed 8.7.  He admitted of noncompliance.  Renal ultrasound showed prostatomegaly.  Overnight with IV fluids his symptoms improved, he was seen by diabetic coordinator. Today medically stable for discharge with outpatient recommendation as above.  Today his creatinine is 0.93.  Rapidly improved overnight with IV fluids.   AKI Admission creatinine 2.93, baseline 0.9.  Overnight rapidly improved with IV fluids.  Renal ultrasound showed prostatomegaly.  Follow-up outpatient with PCP.   Constipation -CT abdomen pelvis is unremarkable.  Abdomen is overall feeling better.  We will discharge him on bowel regimen.   HTN -Lisinopril 20 mg daily prescription given.   Advised to follow-up outpatient PCP and keep blood pressure logs so he can be further adjusted   HLD Refill for statin given   DM 2, uncontrolled -A1c 8.7.  He is noncompliant.  Metformin prescribed upon discharge   ETOH Dependence -Not showing any signs of alcohol withdrawal.  Advised to quit drinking.  We will place him on daily PPI   Tobacco Dependence -Counseled to quit smoking    Discharge Diagnoses:  Principal Problem:   AKI (acute kidney injury) ([place]) Active Problems:   Hypertension   Diabetes mellitus type 2 in nonobese ([place])   Tobacco dependence   Alcohol dependence ([place])   Constipation   Dyslipidemia      Consultations: None  Subjective: Feels great no complaints.  Discharge Exam: Vitals:   04/11/22 0654 04/11/22 0823  BP: 134/78 (!) 145/61  Pulse: 77 75  Resp: 18 19  Temp: 97.7 F (36.5 C) 98.1 F (36.7 C)  SpO2: 100% 100%   Vitals:   04/10/22 1600 04/10/22 2114 04/11/22 0654 04/11/22 0823  BP: 129/77 (!) 155/78 134/78 (!) 145/61  Pulse: 60 74 77 75  Resp:  _0 Temp:  98.9 F (37.2 C) 97.7 F (36.5 C) 98.1 F (36.7 C)  TempSrc:   Oral Oral  SpO2:  100% 100% 100%  Weight:      Height:        General: Pt is alert, awake, not in acute distress Cardiovascular: RRR, S1/S2 +, no rubs, no gallops Respiratory: CTA bilaterally, no wheezing, no rhonchi Abdominal: Soft, NT, ND, bowel sounds + Extremities: no edema, no cyanosis  Discharge Instructions   Allergies as of 04/11/2022  Reactions   Penicillins Anaphylaxis   Has patient had a PCN reaction causing immediate rash, facial/tongue/throat swelling, SOB or lightheadedness with hypotension: Yes Has patient had a PCN reaction causing severe rash involving mucus membranes or skin necrosis: Yes Has patient had a PCN reaction that required hospitalization: Yes Has patient had a PCN reaction occurring within the last 10 years: No If all of the above answers are "NO", then may  proceed with Cephalosporin use.   Amlodipine Other (See Comments)   Light-headedness        Medication List     STOP taking these medications    ibuprofen 800 MG tablet Commonly known as: ADVIL       TAKE these medications    bisacodyl 5 MG EC tablet Commonly known as: DULCOLAX Take 1 tablet (5 mg total) by mouth daily as needed for moderate constipation.   blood glucose meter kit and supplies Kit Dispense based on patient and insurance preference. Use up to four times daily as directed. (FOR ICD-9 250.00, 250.01).   docusate sodium 100 MG capsule Commonly known as: COLACE Take 1 capsule (100 mg total) by mouth 2 (two) times daily for 5 days.   FreeStyle Lite Devi Check blood sugar up to 1 time a day   FREESTYLE LITE test strip Generic drug: glucose blood Check blood sugar up to 1 time a day   Lancets 30G Misc Check blood sugar up to 1 time a day   lisinopril 20 MG tablet Commonly known as: ZESTRIL Take 1 tablet (20 mg total) by mouth daily. What changed:  medication strength how much to take   metFORMIN 1000 MG tablet Commonly known as: GLUCOPHAGE Take 1 tablet (1,000 mg total) by mouth 2 (two) times daily.   simvastatin 20 MG tablet Commonly known as: ZOCOR Take 1 tablet (20 mg total) by mouth at bedtime.        Allergies  Allergen Reactions   Penicillins Anaphylaxis    Has patient had a PCN reaction causing immediate rash, facial/tongue/throat swelling, SOB or lightheadedness with hypotension: Yes Has patient had a PCN reaction causing severe rash involving mucus membranes or skin necrosis: Yes Has patient had a PCN reaction that required hospitalization: Yes Has patient had a PCN reaction occurring within the last 10 years: No If all of the above answers are "NO", then may proceed with Cephalosporin use.    Amlodipine Other (See Comments)    Light-headedness    You were cared for by a hospitalist during your hospital stay. If you have any  questions about your discharge medications or the care you received while you were in the hospital after you are discharged, you can call the unit and asked to speak with the hospitalist on call if the hospitalist that took care of you is not available. Once you are discharged, your primary care physician will handle any further medical issues. Please note that no refills for any discharge medications will be authorized once you are discharged, as it is imperative that you return to your primary care physician (or establish a relationship with a primary care physician if you do not have one) for your aftercare needs so that they can reassess your need for medications and monitor your lab values.   Procedures/Studies: US RENAL  Result Date: 04/10/2022 CLINICAL DATA:  Acute renal insufficiency EXAM: RENAL / URINARY TRACT ULTRASOUND COMPLETE COMPARISON:  None Available. FINDINGS: Right Kidney: Renal measurements: 10.3 x 4.6 x 4.8 cm = volume: 119 mL.  Echogenicity within normal limits. No mass or hydronephrosis visualized. Left Kidney: Renal measurements: 10.4 x 6.0 x 4.5 cm = volume: 147 mL. Echogenicity within normal limits. No mass or hydronephrosis visualized. Bladder: Appears normal for degree of bladder distention. Other: The prostate is enlarged measuring 3.1 x 4.1 x 5.1 cm. IMPRESSION: 1. The kidneys and bladder are unremarkable. 2. The prostate is enlarged measuring 3.1 x 4.1 x 5.1 cm. Electronically Signed   By: Dorise Bullion III M.D.   On: 04/10/2022 09:59   CT ABDOMEN PELVIS WO CONTRAST  Result Date: 04/09/2022 CLINICAL DATA:  Constipation, decreased appetite, abdominal pain EXAM: CT ABDOMEN AND PELVIS WITHOUT CONTRAST TECHNIQUE: Multidetector CT imaging of the abdomen and pelvis was performed following the standard protocol without IV contrast. RADIATION DOSE REDUCTION: This exam was performed according to the departmental dose-optimization program which includes automated exposure control,  adjustment of the mA and/or kV according to patient size and/or use of iterative reconstruction technique. COMPARISON:  None Available. FINDINGS: Lower chest: Mild dependent atelectasis. Linear scarring present in the inferior middle lobe. The heart is at the upper limits of normal for size. No pericardial effusion. Unremarkable distal esophagus. Hepatobiliary: Large areas of geographic hypoattenuation throughout the liver. Mild sparing adjacent to the gallbladder fossa. Findings are consistent with fatty infiltration. No discrete mass lesion. No intra or extrahepatic biliary ductal dilatation. Pancreas: Unremarkable. No pancreatic ductal dilatation or surrounding inflammatory changes. Spleen: No splenic injury or perisplenic hematoma. Adrenals/Urinary Tract: Normal adrenal glands. No hydronephrosis, nephrolithiasis or renal contour abnormality. The ureters and bladder are unremarkable. Stomach/Bowel: Normal appearance of the stomach, duodenum, small bowel and colon. Colonic stool burden is unremarkable. No evidence of fecal impaction. Normal appearing terminal ileum and appendix. Vascular/Lymphatic: Limited evaluation in the absence of intravenous contrast. Atherosclerotic calcifications visible throughout the abdominal aorta. No evidence of aneurysm. No suspicious lymphadenopathy. Reproductive: Prostate is unremarkable. Other: No abdominal wall hernia or abnormality. No abdominopelvic ascites. 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 IMPRESSION: 1. No acute abnormality within the abdomen or pelvis. 2. Large geographic regions of hepatic steatosis. 3.  Aortic Atherosclerosis (ICD10-I70.0). 4. 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 Electronically Signed   By: Jacqulynn Cadet M.D.   On: 04/09/2022 07:29     The results of significant diagnostics from this hospitalization (including imaging, microbiology, ancillary and laboratory) are listed below for  reference.     Microbiology: No results found for this or any previous visit (from the past 240 hour(s)).   Labs: BNP (last 3 results) No results for input(s): "BNP" in the last 8760 hours. Basic Metabolic Panel: Recent Labs  Lab 04/09/22 0409 04/11/22 0433  NA 134* 139  K 3.9 3.6  CL 86* 101  CO2 30 30  GLUCOSE 250* 108*  BUN 50* 35*  CREATININE 2.93* 0.93  CALCIUM 8.6* 8.7*   Liver Function Tests: Recent Labs  Lab 04/09/22 0409  AST 19  ALT 24  ALKPHOS 72  BILITOT 0.9  PROT 8.0  ALBUMIN 4.3   Recent Labs  Lab 04/09/22 0409  LIPASE 30   No results for input(s): "AMMONIA" in the last 168 hours. CBC: Recent Labs  Lab 04/09/22 0409 04/11/22 0433  WBC 10.4 7.9  NEUTROABS 8.7*  --   HGB 15.5 13.3  HCT 44.6 39.4  MCV 87.3 89.5  PLT 337 275   Cardiac Enzymes: No results for input(s): "CKTOTAL", "CKMB", "CKMBINDEX", "TROPONINI" in the last 168 hours. BNP: Invalid input(s): "POCBNP" CBG:  Recent Labs  Lab 04/10/22 1157 04/10/22 1635 04/10/22 2113 04/11/22 0749 04/11/22 1113  GLUCAP 170* 181* 213* 243* 129*   D-Dimer No results for input(s): "DDIMER" in the last 72 hours. Hgb A1c Recent Labs    04/10/22 1512  HGBA1C 8.7*   Lipid Profile No results for input(s): "CHOL", "HDL", "LDLCALC", "TRIG", "CHOLHDL", "LDLDIRECT" in the last 72 hours. Thyroid function studies No results for input(s): "TSH", "T4TOTAL", "T3FREE", "THYROIDAB" in the last 72 hours.  Invalid input(s): "FREET3" Anemia work up No results for input(s): "VITAMINB12", "FOLATE", "FERRITIN", "TIBC", "IRON", "RETICCTPCT" in the last 72 hours. Urinalysis    Component Value Date/Time   COLORURINE YELLOW 04/09/2022 0423   APPEARANCEUR HAZY (A) 04/09/2022 0423   LABSPEC 1.017 04/09/2022 0423   PHURINE 5.0 04/09/2022 0423   GLUCOSEU 50 (A) 04/09/2022 0423   HGBUR NEGATIVE 04/09/2022 0423   BILIRUBINUR NEGATIVE 04/09/2022 0423   KETONESUR NEGATIVE 04/09/2022 0423   PROTEINUR  NEGATIVE 04/09/2022 0423   NITRITE NEGATIVE 04/09/2022 0423   LEUKOCYTESUR MODERATE (A) 04/09/2022 0423   Sepsis Labs Recent Labs  Lab 04/09/22 0409 04/11/22 0433  WBC 10.4 7.9   Microbiology No results found for this or any previous visit (from the past 240 hour(s)).   Time coordinating discharge:  I have spent 35 minutes face to face with the patient and on the ward discussing the patients care, assessment, plan and disposition with other care givers. >50% of the time was devoted counseling the patient about the risks and benefits of treatment/Discharge disposition and coordinating care.   SIGNED:   Damita Lack, MD  [facility] 04/11/2022, 11:20 AM   If 7PM-7AM, please contact night-coverage

## 2022-04-11 NOTE — Progress Notes (Incomplete)
Juan Boyle to be discharged Home per MD order. Discussed prescriptions and follow up appointments with the patient. Prescriptions given to patient; medication list explained in detail. Patient verbalized understanding.  Skin clean, dry and intact without evidence of skin break down, no evidence of skin tears noted. IV catheter discontinued intact. Site without signs and symptoms of complications. Dressing and pressure applied. Pt denies pain at the site currently. No complaints noted.  Patient free of lines, drains, and wounds.   An After Visit Summary (AVS) was printed and given to the patient. Patient escorted via wheelchair, and discharged home via private auto.  Amaryllis Dyke, RN

## 2022-04-13 ENCOUNTER — Encounter: Payer: Self-pay | Admitting: Internal Medicine

## 2024-05-24 ENCOUNTER — Telehealth: Payer: Self-pay

## 2024-05-24 NOTE — Telephone Encounter (Signed)
 Patient was identified as falling into the [place] Measure - Diabetes.   Patient was: Patient is not currently using our practice.
